# Patient Record
Sex: Male | Born: 1935 | Race: White | Hispanic: No | Marital: Married | Smoking: Former smoker
Health system: Southern US, Community
[De-identification: ages and names within clinical notes are randomized; demographics above are authoritative.]

## PROBLEM LIST (undated history)

## (undated) DIAGNOSIS — C801 Malignant (primary) neoplasm, unspecified: Secondary | ICD-10-CM

## (undated) DIAGNOSIS — M1990 Unspecified osteoarthritis, unspecified site: Secondary | ICD-10-CM

## (undated) DIAGNOSIS — I10 Essential (primary) hypertension: Secondary | ICD-10-CM

## (undated) DIAGNOSIS — R531 Weakness: Secondary | ICD-10-CM

## (undated) DIAGNOSIS — N179 Acute kidney failure, unspecified: Secondary | ICD-10-CM

## (undated) DIAGNOSIS — J40 Bronchitis, not specified as acute or chronic: Secondary | ICD-10-CM

## (undated) DIAGNOSIS — E785 Hyperlipidemia, unspecified: Secondary | ICD-10-CM

## (undated) DIAGNOSIS — G2581 Restless legs syndrome: Secondary | ICD-10-CM

## (undated) DIAGNOSIS — R55 Syncope and collapse: Secondary | ICD-10-CM

## (undated) DIAGNOSIS — IMO0001 Reserved for inherently not codable concepts without codable children: Secondary | ICD-10-CM

## (undated) DIAGNOSIS — F0390 Unspecified dementia without behavioral disturbance: Secondary | ICD-10-CM

## (undated) DIAGNOSIS — I639 Cerebral infarction, unspecified: Secondary | ICD-10-CM

## (undated) DIAGNOSIS — M109 Gout, unspecified: Secondary | ICD-10-CM

## (undated) DIAGNOSIS — K219 Gastro-esophageal reflux disease without esophagitis: Secondary | ICD-10-CM

## (undated) DIAGNOSIS — I219 Acute myocardial infarction, unspecified: Secondary | ICD-10-CM

## (undated) DIAGNOSIS — I6529 Occlusion and stenosis of unspecified carotid artery: Principal | ICD-10-CM

## (undated) DIAGNOSIS — E039 Hypothyroidism, unspecified: Secondary | ICD-10-CM

## (undated) HISTORY — DX: Essential (primary) hypertension: I10

## (undated) HISTORY — PX: COLONOSCOPY W/ POLYPECTOMY: SHX1380

## (undated) HISTORY — DX: Occlusion and stenosis of unspecified carotid artery: I65.29

## (undated) HISTORY — PX: CORONARY ANGIOPLASTY: SHX604

## (undated) HISTORY — DX: Acute myocardial infarction, unspecified: I21.9

## (undated) HISTORY — PX: OTHER SURGICAL HISTORY: SHX169

## (undated) HISTORY — DX: Unspecified osteoarthritis, unspecified site: M19.90

## (undated) HISTORY — PX: CORONARY ARTERY BYPASS GRAFT: SHX141

## (undated) HISTORY — PX: KNEE ARTHROSCOPY: SUR90

---

## 2002-04-30 ENCOUNTER — Encounter: Payer: Self-pay | Admitting: Vascular Surgery

## 2002-05-01 ENCOUNTER — Ambulatory Visit (HOSPITAL_COMMUNITY): Admission: RE | Admit: 2002-05-01 | Discharge: 2002-05-01 | Payer: Self-pay | Admitting: Vascular Surgery

## 2002-05-01 HISTORY — PX: OTHER SURGICAL HISTORY: SHX169

## 2009-07-15 ENCOUNTER — Ambulatory Visit: Payer: Self-pay | Admitting: Vascular Surgery

## 2010-04-06 ENCOUNTER — Ambulatory Visit: Payer: Self-pay | Admitting: Vascular Surgery

## 2010-10-04 NOTE — Procedures (Signed)
CAROTID DUPLEX EXAM   INDICATION:  Carotid stenosis.   HISTORY:  Diabetes:  Yes.  Cardiac:  MI.  Hypertension:  Yes.  Smoking:  Previous.  Previous Surgery:  No.  CV History:  Asymptomatic.  Amaurosis Fugax No, Paresthesias No, Hemiparesis No.                                       RIGHT             LEFT  Brachial systolic pressure:         147               155  Brachial Doppler waveforms:         Normal            Normal  Vertebral direction of flow:        Antegrade         Antegrade  DUPLEX VELOCITIES (cm/sec)  CCA peak systolic                   73                89  ECA peak systolic                   93                424  ICA peak systolic                   327               150  ICA end diastolic                   80                32  PLAQUE MORPHOLOGY:                  Heterogenous      Heterogenous  PLAQUE AMOUNT:                      Moderate          Mild/moderate  PLAQUE LOCATION:                    ICA, CCA, ECA     ICA, ECA, CCA   IMPRESSION:  1. Right internal carotid artery velocities suggest a 60% to 79%      stenosis.  2. Left internal carotid artery velocities suggest a 40% to 59%      stenosis.  3. Left external carotid artery stenosis.   ___________________________________________  Di Kindle. Edilia Bo, M.D.   EM/MEDQ  D:  04/06/2010  T:  04/06/2010  Job:  [id number]

## 2010-10-04 NOTE — Procedures (Signed)
CAROTID DUPLEX EXAM   INDICATION:  Carotid bruit.   HISTORY:  Diabetes:  Yes.  Cardiac:  MI.  Hypertension:  Yes.  Smoking:  Previous.  Previous Surgery:  No carotid surgery.  CV History:  Asymptomatic.  Amaurosis Fugax No, Paresthesias No, Hemiparesis No                                       RIGHT             LEFT  Brachial systolic pressure:         158               150  Brachial Doppler waveforms:         WNL               WNL  Vertebral direction of flow:        Antegrade         Antegrade  DUPLEX VELOCITIES (cm/sec)  CCA peak systolic                   101               108  ECA peak systolic                   97                303  ICA peak systolic                   240               149  ICA end diastolic                   65                34  PLAQUE MORPHOLOGY:                  Heterogeneous     Heterogeneous  PLAQUE AMOUNT:                      Moderate          Mild / moderate  PLAQUE LOCATION:                    ICA / ECA / CCA   ICA / ECA / CCA   IMPRESSION:  1. Right internal carotid artery shows evidence of 60%-79% stenosis.  2. Left internal carotid artery shows evidence of 40%-59% stenosis.  3. Left external carotid artery stenosis.   ___________________________________________  Di Kindle. Edilia Bo, M.D.   AS/MEDQ  D:  07/15/2009  T:  07/15/2009  Job:  [id number]

## 2010-10-04 NOTE — Consult Note (Signed)
NEW PATIENT CONSULTATION   CHIPPER, KOUDELKA  DOB:  May 31, 1932                                       07/15/2009  ZOXWR#:[id number]   I saw the patient in the office today in consultation concerning his  peripheral vascular disease.  This is a pleasant 77 year old gentleman  who states that he has had years of mild claudication symptoms in his  right calf.  His symptoms are brought on by ambulation and relieved with  rest.  Symptoms were barely noticeable for years.  However,  approximately 3-4 months ago he began noticing some progression of  symptoms in his right calf.  He would experience pain in the calf  associated with ambulation and relieved with rest.  He had no  significant thigh or hip claudication and no symptoms of the left leg.  There were no other aggravating or alleviating factors.  He has no  significant pain at rest.  There are no associated symptoms.  Dr. Shary Decamp  arranged a Doppler study which showed evidence of moderate arterial  insufficiency on the right and he was sent for vascular consultation.   PAST MEDICAL HISTORY:  Significant for type 2 diabetes.  He does not  require insulin.  In addition, he has hypertension, hypercholesterolemia  and coronary artery disease.  He had a myocardial infarction in 2008.  He denies any history of congestive heart failure and has had no history  of COPD.  He has had a history of a DVT in the left leg approximately 6  years ago and he also had an IVC filter placed in the past.  He  underwent coronary revascularization approximately 13-14 years ago.  He  has also had knee surgery bilaterally.   FAMILY HISTORY:  His father died from a myocardial infarction at age 36.  He is unaware of any other history of premature cardiovascular disease.   SOCIAL HISTORY:  He is married.  He does not have any biologic children.  He quit tobacco in 1979.   MEDICATIONS:  His medications are documented on the medical history  form  in his chart.  He is on aspirin.   REVIEW OF SYSTEMS:  GENERAL:  He has had no recent weight loss, weight  gain or problems with his appetite.  CARDIOVASCULAR:  He has had no chest pain, chest pressure, palpitations  or arrhythmias.  He has had no significant orthopnea or dyspnea on  exertion.  He has had no history of stroke, TIAs, expressive or  receptive aphasia or amaurosis fugax.  MUSCULOSKELETAL:  He does have pain in both knees, especially the right  knee.  HEMATOLOGIC:  He has had a previous DVT as described above.  He has had  no bleeding problems that he is aware of.  PULMONARY:  He has had occasional bronchitis.  GI, GU, neurologic, psychiatric, ENT and integumentary review of systems  is unremarkable and is documented on the medical history form in his  chart.   PHYSICAL EXAMINATION:  General:  This is a pleasant 77 year old  gentleman who appears his stated age.  Vital signs:  Blood pressure is  160/74, heart rate is 70, temperature is 98.  HEENT:  Unremarkable.  Lungs:  Are clear bilaterally to auscultation without rales, rhonchi or  wheezing.  Cardiovascular:  He has a right carotid bruit.  He has a  regular rate and rhythm without murmur appreciated.  He has no  significant peripheral edema.  He has palpable femoral pulses.  On the  left side he has a palpable popliteal and dorsalis pedis pulse.  I  cannot palpate a posterior tibial pulse.  On the right side he has a  normal femoral pulse but I am unable to palpate popliteal or pedal  pulses on the right.  Abdomen:  His abdomen is soft and nontender.  No  masses are appreciated.  He has normal pitched bowel sounds.  I cannot  appreciate an aneurysm.  Musculoskeletal:  There are no major  deformities or cyanosis.  Neurologic:  He has no focal weakness or  paresthesias.  Skin:  There are no ulcers or rashes.  Lymphatic:  He has  no significant cervical, axillary or inguinal lymphadenopathy.  He does  have mild  swelling more significant swelling in the left leg than the  right leg.  He had vein previously taken from the left leg for his CABG  and also has had a DVT in the left leg before.   I did review his arterial Doppler study which was done on 06/29/2009 at  [facility] in [place] and this showed that he had monophasic  signals in the right leg below the inguinal level although his vessels  were not compressible so ABI could not be obtained on the right.  He was  felt to most likely have a short segment occlusion of the distal right  superficial femoral artery.  On the left side he had an ABI of 100%  although this too may be falsely elevated because of calcific disease.  Toe pressure on the right was 27 mmHg and on the left 13 mmHg consistent  with significant infrainguinal arterial occlusive disease.   Given the carotid bruit on the right we did obtain a carotid duplex scan  which shows a 60%-79% right carotid stenosis.  He has no significant  stenosis on the left although he has an external carotid artery stenosis  on the left.  Left internal carotid artery only has a 40%-59% stenosis.   With respect to his peripheral vascular disease I think his symptoms are  quite stable and I would not recommend any further workup unless he  developed disabling claudication, rest pain or nonhealing ulcers.  We  have discussed the importance of a structured walking program.   With respect to his carotid stenosis I have recommended a followup  carotid duplex scan in 6 months and I will see him back at that time.  He knows to continue taking his aspirin.  We have also reviewed the  potential symptoms of cerebrovascular disease and he knows to call if he  develops any new symptoms.  We will see him back in 6 months.     Di Kindle. Edilia Bo, M.D.  Electronically Signed   CSD/MEDQ  D:  07/15/2009  T:  07/16/2009  Job:  [id number]   cc:   Feliciana Rossetti, MD

## 2010-10-07 NOTE — Op Note (Signed)
   NAME:  Johnny Murphy, Johnny Murphy                          ACCOUNT NO.:  [id number]   MEDICAL RECORD NO.:  [id number]                   PATIENT TYPE:  OIB   LOCATION:  NA                                   FACILITY:  [facility]   PHYSICIAN:  Larina Earthly, M.D.                 DATE OF BIRTH:  10-Dec-1932   DATE OF PROCEDURE:  05/01/2002  DATE OF DISCHARGE:                                 OPERATIVE REPORT   PREOPERATIVE DIAGNOSES:  Extension of left leg deep venous thrombosis on  Coumadin therapy.   POSTOPERATIVE DIAGNOSES:  Extension of left leg deep venous thrombosis on  Coumadin therapy.   PROCEDURE:  1. Inferior venacavagram.  2. Placement of TrapEase vena caval filter.   SURGEON:  Larina Earthly, M.D.   ANESTHESIA:  1% lidocaine local.   COMPLICATIONS:  None.   DISPOSITION:  To holding area stable.   DESCRIPTION OF PROCEDURE:  The patient was taken to the peripheral vascular  cath lab, placed in supine position where the area of the right groin was  prepped and draped in the usual sterile fashion. Using local anesthesia and  a single wall puncture, the right common femoral vein was entered and a  guidewire was passed up to the level of the renal veins. The TrapEase sheath  was positioned at the level of L2 and a flush venacavagram was undertaken.  This revealed the takeoff of the right and left renal veins. The right renal  vein was the lowest renal vein. The flush catheter was then removed and the  TrapEase vena caval filter was positioned in the sheath and this was  deployed at approximately L2 just below the takeoff of the right renal vein.  There was good positioning of the filter. The sheath was removed and  pressure was held. The patient had no immediate complications and was  transferred to the holding area in stable condition.                                               Larina Earthly, M.D.    TFE/MEDQ  D:  05/01/2002  T:  05/01/2002  Job:  [id number]   cc:   Altamese Cornish,  M.D.  [place], [place]

## 2010-11-18 ENCOUNTER — Ambulatory Visit: Payer: Self-pay

## 2010-11-18 ENCOUNTER — Other Ambulatory Visit: Payer: Self-pay

## 2010-12-02 ENCOUNTER — Other Ambulatory Visit (INDEPENDENT_AMBULATORY_CARE_PROVIDER_SITE_OTHER): Payer: Medicare Other

## 2010-12-02 ENCOUNTER — Ambulatory Visit (INDEPENDENT_AMBULATORY_CARE_PROVIDER_SITE_OTHER): Payer: Medicare Other | Admitting: Thoracic Diseases

## 2010-12-02 DIAGNOSIS — I10 Essential (primary) hypertension: Secondary | ICD-10-CM

## 2010-12-02 DIAGNOSIS — I6529 Occlusion and stenosis of unspecified carotid artery: Secondary | ICD-10-CM

## 2010-12-02 DIAGNOSIS — M1711 Unilateral primary osteoarthritis, right knee: Secondary | ICD-10-CM | POA: Insufficient documentation

## 2010-12-02 DIAGNOSIS — E1151 Type 2 diabetes mellitus with diabetic peripheral angiopathy without gangrene: Secondary | ICD-10-CM

## 2010-12-02 HISTORY — DX: Occlusion and stenosis of unspecified carotid artery: I65.29

## 2010-12-02 NOTE — Patient Instructions (Signed)
Pt given information on stroke symptomotology and carotid disease.

## 2010-12-02 NOTE — Progress Notes (Signed)
VASCULAR AND VEIN SURGERY CAROTID FOLLOW-UP  HPI: Johnny Murphy is a 77 y.o. male right handed who has known carotid disease. Pt. has not had surgical intervention. Patient is doing well. Pt. denies symptoms of TIA or Stroke. Pt. denies weakness;pt denies symptoms of amaurosis fugax; denies monocular blindness. Pt. Has normal speech denies aphasic symptoms   Non-Invasive Vascular Imaging CAROTID DUPLEX (Date: 12/02/2010):  Right ICA 60 - 79 % stenosis Left ICA 20 - 39 % stenosis  These findings are Unchanged from previous exam  Medications reviewed and unchanged. Pt is on Betablocker and ASA 81mg  BID  Past Medical History  Diagnosis Date  . Diabetes mellitus   . Hypertension   . Myocardial infarction   . Arthritis     ROS: 12 point ROS negative except for as noted in PMHx and hx fractured right foot not requiring surgery  Physical Exam: Pt is A&O x 3 Gait is normal Negative Bilateral carotid bruit/s Speech is normal 5/5 strength BUE/BLE  Negative tongue deviation Negative facial droop  Plan: Follow-up in 6 months with Carotid Duplex scan and letter  Pt was given information regarding stroke symptoms and prevention

## 2010-12-14 NOTE — Procedures (Unsigned)
CAROTID DUPLEX EXAM  INDICATION:  Follow up known carotid disease.  HISTORY: Diabetes:  Yes. Cardiac:  Yes. Hypertension:  Yes. Smoking:  Previous. Previous Surgery: CV History: Amaurosis Fugax No, Paresthesias No, Hemiparesis No.                                      RIGHT               LEFT Brachial systolic pressure:         130                 110 Brachial Doppler waveforms: Vertebral direction of flow:        Abnormal antegrade  Antegrade DUPLEX VELOCITIES (cm/sec) CCA peak systolic                   97                  78 ECA peak systolic                   84                  274 ICA peak systolic                   272                 140 ICA end diastolic                   84                  30 PLAQUE MORPHOLOGY:                  Heterogenous        Heterogenous PLAQUE AMOUNT:                      Moderate            Mild PLAQUE LOCATION:                    CCA/ICA/subclavian  CCA/ECA/ICA  IMPRESSION: 1. 60% to 79% right internal carotid artery stenosis. 2. 1% to 39% left internal carotid artery stenosis with distal     arterial tortuosity. 3. Significant stenosis of right subclavian artery with velocities at     346 cm/s.  The right vertebral artery is abnormal. 4. Left subclavian and vertebral artery are within normal limits.  ___________________________________________ Di Kindle. Edilia Bo, M.D.  LT/MEDQ  D:  12/02/2010  T:  12/02/2010  Job:  [id number]

## 2011-06-09 ENCOUNTER — Other Ambulatory Visit: Payer: Medicare Other

## 2012-03-29 ENCOUNTER — Encounter: Payer: Self-pay | Admitting: Vascular Surgery

## 2015-08-28 ENCOUNTER — Encounter (HOSPITAL_COMMUNITY): Payer: Self-pay | Admitting: *Deleted

## 2015-08-28 ENCOUNTER — Observation Stay (HOSPITAL_COMMUNITY)
Admission: AD | Admit: 2015-08-28 | Discharge: 2015-09-02 | Disposition: A | Discharge status: Home / Self Care | Payer: Medicare Other | Source: Other Acute Inpatient Hospital | Attending: Internal Medicine | Admitting: Internal Medicine

## 2015-08-28 DIAGNOSIS — E1151 Type 2 diabetes mellitus with diabetic peripheral angiopathy without gangrene: Secondary | ICD-10-CM | POA: Insufficient documentation

## 2015-08-28 DIAGNOSIS — I6503 Occlusion and stenosis of bilateral vertebral arteries: Secondary | ICD-10-CM | POA: Insufficient documentation

## 2015-08-28 DIAGNOSIS — Z951 Presence of aortocoronary bypass graft: Secondary | ICD-10-CM | POA: Diagnosis not present

## 2015-08-28 DIAGNOSIS — G459 Transient cerebral ischemic attack, unspecified: Secondary | ICD-10-CM | POA: Diagnosis present

## 2015-08-28 DIAGNOSIS — E039 Hypothyroidism, unspecified: Secondary | ICD-10-CM | POA: Diagnosis present

## 2015-08-28 DIAGNOSIS — E118 Type 2 diabetes mellitus with unspecified complications: Secondary | ICD-10-CM | POA: Diagnosis not present

## 2015-08-28 DIAGNOSIS — I5032 Chronic diastolic (congestive) heart failure: Secondary | ICD-10-CM | POA: Diagnosis not present

## 2015-08-28 DIAGNOSIS — I2510 Atherosclerotic heart disease of native coronary artery without angina pectoris: Secondary | ICD-10-CM | POA: Insufficient documentation

## 2015-08-28 DIAGNOSIS — I252 Old myocardial infarction: Secondary | ICD-10-CM | POA: Insufficient documentation

## 2015-08-28 DIAGNOSIS — E1122 Type 2 diabetes mellitus with diabetic chronic kidney disease: Secondary | ICD-10-CM | POA: Diagnosis not present

## 2015-08-28 DIAGNOSIS — R531 Weakness: Secondary | ICD-10-CM | POA: Diagnosis not present

## 2015-08-28 DIAGNOSIS — Z9861 Coronary angioplasty status: Secondary | ICD-10-CM | POA: Diagnosis not present

## 2015-08-28 DIAGNOSIS — Z8673 Personal history of transient ischemic attack (TIA), and cerebral infarction without residual deficits: Secondary | ICD-10-CM | POA: Diagnosis not present

## 2015-08-28 DIAGNOSIS — Z794 Long term (current) use of insulin: Secondary | ICD-10-CM | POA: Diagnosis not present

## 2015-08-28 DIAGNOSIS — I10 Essential (primary) hypertension: Secondary | ICD-10-CM | POA: Diagnosis not present

## 2015-08-28 DIAGNOSIS — E785 Hyperlipidemia, unspecified: Secondary | ICD-10-CM | POA: Diagnosis not present

## 2015-08-28 DIAGNOSIS — I429 Cardiomyopathy, unspecified: Secondary | ICD-10-CM | POA: Insufficient documentation

## 2015-08-28 DIAGNOSIS — N179 Acute kidney failure, unspecified: Secondary | ICD-10-CM | POA: Diagnosis not present

## 2015-08-28 DIAGNOSIS — I130 Hypertensive heart and chronic kidney disease with heart failure and stage 1 through stage 4 chronic kidney disease, or unspecified chronic kidney disease: Secondary | ICD-10-CM | POA: Insufficient documentation

## 2015-08-28 DIAGNOSIS — F0390 Unspecified dementia without behavioral disturbance: Secondary | ICD-10-CM | POA: Insufficient documentation

## 2015-08-28 DIAGNOSIS — I441 Atrioventricular block, second degree: Secondary | ICD-10-CM | POA: Insufficient documentation

## 2015-08-28 DIAGNOSIS — Z87891 Personal history of nicotine dependence: Secondary | ICD-10-CM | POA: Diagnosis not present

## 2015-08-28 DIAGNOSIS — I6521 Occlusion and stenosis of right carotid artery: Secondary | ICD-10-CM | POA: Diagnosis present

## 2015-08-28 DIAGNOSIS — Z7982 Long term (current) use of aspirin: Secondary | ICD-10-CM | POA: Insufficient documentation

## 2015-08-28 DIAGNOSIS — N183 Chronic kidney disease, stage 3 (moderate): Secondary | ICD-10-CM | POA: Diagnosis not present

## 2015-08-28 DIAGNOSIS — E1165 Type 2 diabetes mellitus with hyperglycemia: Secondary | ICD-10-CM | POA: Diagnosis not present

## 2015-08-28 DIAGNOSIS — I6529 Occlusion and stenosis of unspecified carotid artery: Secondary | ICD-10-CM | POA: Diagnosis present

## 2015-08-28 DIAGNOSIS — R55 Syncope and collapse: Secondary | ICD-10-CM | POA: Diagnosis present

## 2015-08-28 DIAGNOSIS — G450 Vertebro-basilar artery syndrome: Secondary | ICD-10-CM

## 2015-08-28 DIAGNOSIS — E119 Type 2 diabetes mellitus without complications: Secondary | ICD-10-CM

## 2015-08-28 DIAGNOSIS — I6523 Occlusion and stenosis of bilateral carotid arteries: Secondary | ICD-10-CM | POA: Insufficient documentation

## 2015-08-28 HISTORY — DX: Acute kidney failure, unspecified: N17.9

## 2015-08-28 LAB — GLUCOSE, CAPILLARY
Glucose-Capillary: 272 mg/dL — ABNORMAL HIGH (ref 65–99)
Glucose-Capillary: 306 mg/dL — ABNORMAL HIGH (ref 65–99)

## 2015-08-28 LAB — PHOSPHORUS: Phosphorus: 3.6 mg/dL (ref 2.5–4.6)

## 2015-08-28 LAB — MAGNESIUM: Magnesium: 1.5 mg/dL — ABNORMAL LOW (ref 1.7–2.4)

## 2015-08-28 LAB — TSH: TSH: 2.51 u[IU]/mL (ref 0.350–4.500)

## 2015-08-28 LAB — TROPONIN I: TROPONIN I: 0.03 ng/mL (ref <0.031)

## 2015-08-28 MED ORDER — PRAVASTATIN SODIUM 20 MG PO TABS
20.0000 mg | ORAL_TABLET | Freq: Every day | ORAL | Status: DC
  Administered 2015-08-28 – 2015-09-01 (×5): 20 mg via ORAL
  Filled 2015-08-28 (×5): qty 1

## 2015-08-28 MED ORDER — STROKE: EARLY STAGES OF RECOVERY BOOK
Freq: Once | Status: AC
  Administered 2015-08-28: 23:00:00

## 2015-08-28 MED ORDER — ONDANSETRON HCL 4 MG PO TABS: 4.0000 mg | ORAL_TABLET | Freq: Four times a day (QID) | ORAL | Status: DC | PRN

## 2015-08-28 MED ORDER — ASPIRIN 81 MG PO CHEW
162.0000 mg | CHEWABLE_TABLET | Freq: Every day | ORAL | Status: DC
  Administered 2015-08-29: 162 mg via ORAL
  Filled 2015-08-28: qty 2

## 2015-08-28 MED ORDER — LEVOTHYROXINE SODIUM 25 MCG PO TABS
25.0000 ug | ORAL_TABLET | Freq: Every day | ORAL | Status: DC
  Administered 2015-08-29 – 2015-09-02 (×5): 25 ug via ORAL
  Filled 2015-08-28 (×5): qty 1

## 2015-08-28 MED ORDER — INSULIN GLARGINE 100 UNIT/ML ~~LOC~~ SOLN
10.0000 [IU] | Freq: Every day | SUBCUTANEOUS | Status: DC
  Administered 2015-08-28: 10 [IU] via SUBCUTANEOUS
  Filled 2015-08-28 (×2): qty 0.1

## 2015-08-28 MED ORDER — SENNOSIDES-DOCUSATE SODIUM 8.6-50 MG PO TABS: 1.0000 | ORAL_TABLET | Freq: Every evening | ORAL | Status: DC | PRN

## 2015-08-28 MED ORDER — LINAGLIPTIN 5 MG PO TABS
5.0000 mg | ORAL_TABLET | Freq: Every day | ORAL | Status: DC
  Administered 2015-08-29: 5 mg via ORAL
  Filled 2015-08-28: qty 1

## 2015-08-28 MED ORDER — SODIUM CHLORIDE 0.9% FLUSH
3.0000 mL | Freq: Two times a day (BID) | INTRAVENOUS | Status: DC
  Administered 2015-08-28 – 2015-09-01 (×5): 3 mL via INTRAVENOUS

## 2015-08-28 MED ORDER — ENOXAPARIN SODIUM 40 MG/0.4ML ~~LOC~~ SOLN
40.0000 mg | SUBCUTANEOUS | Status: DC
  Administered 2015-08-28: 40 mg via SUBCUTANEOUS
  Filled 2015-08-28: qty 0.4

## 2015-08-28 MED ORDER — ONDANSETRON HCL 4 MG/2ML IJ SOLN: 4.0000 mg | Freq: Four times a day (QID) | INTRAMUSCULAR | Status: DC | PRN

## 2015-08-28 MED ORDER — GLIPIZIDE ER 10 MG PO TB24
10.0000 mg | ORAL_TABLET | Freq: Two times a day (BID) | ORAL | Status: DC
  Filled 2015-08-28: qty 1

## 2015-08-28 MED ORDER — PANTOPRAZOLE SODIUM 40 MG PO TBEC
40.0000 mg | DELAYED_RELEASE_TABLET | Freq: Every day | ORAL | Status: DC
  Administered 2015-08-29 – 2015-09-02 (×5): 40 mg via ORAL
  Filled 2015-08-28 (×5): qty 1

## 2015-08-28 NOTE — Progress Notes (Signed)
Transfer from [name] is a 82 year old male with past medical history significant for HTN, DM type 2, CAD, MI; who presented with 15 minutes of confusion and left-sided weakness. Once EMS arrived patient was noted to have unequal pupils, but back to baseline. Upon arrival to the emergency department patient was noted to have equal pupils, alert and oriented 3, and with without any focal deficits. CT of the brain was seen to have significant signs of atherosclerotic disease. Neurology was called and recommended angiogram and to admit to the hospitalist for observation. Vital signs otherwise noted to be stable. Initial lab work showed a elevated blood glucose 435.

## 2015-08-28 NOTE — Consult Note (Signed)
Reason for consult: syncope  HPI:  82 yo WM with history of HTN, DM, and high lipids presents with a short duration syncope of seconds while eating at a restaurant.  I do not have any detailed eyewitness accounts.  He denies any prodromal dizziness, lightheadedness, chest pain, shortness of breath, or palpitations.  There is some verbal information related to pupillary abnormalities but I do not detect anything.  His glucose was as high as 435.  He had missed that morning's meds. No clear indication of stiffness or abnormal movements.  No incontinence or tongue bite.    CT brain was negative per outside hospital.  Records show he has a history of right ICA stenosis about 3 years ago but without any TIA or strokes in the past.    Meds: 1. ASA 162 mg qd 2. Lovenox 40 mg SQ qd 3. Glipizide XL 20 mg qd 4. Lantus 10 Units qd 5. Synthroid 25 mcg qd 6. Linagliptin 5 mg qd 7. Pravachol 20 mg qd  179/70 P 63 T98 Neuro exam normal.   A/P: Very short duration syncope of unclear etiology.  ?hyperglycemia related.  The prior carotid stenosis may be a factor in reducing cerebral blood flow.  An MRI/MRA is pending.  If ICA stenosis >=70%, I would recommend CEA even if he has not had any TIA or stroke before since syncope may be related and his DM /BP are not well controlled and he would be at high risk of eventual stroke.  He is very high functioning for his age.    The ASA can be reduced to 81 mg qd since higher doses not beneficial.  Since this was not TIA or stroke, no change in antiplatelet therapy is warranted.

## 2015-08-28 NOTE — H&P (Signed)
[facility] History and Physical  Johnny Murphy [id number] DOB: 08-Sep-1932 DOA: 08/28/2015  Referring physician: [facility] ED. PCP: Gilford Rile, MD   Chief Complaint: Syncope.  HPI: Johnny Murphy is a 82 y.o. male with a past medical history of hypertension, carotid artery stenosis, diabetes, hyperlipidemia, hypothyroidism, who is being admitted to the neurology floor after presenting to [facility] via EMS due to syncope.  Per patient, he was eating breakfast with a friend at [employer], when he suddenly passed out without describing any prodromal symptoms. Apparently, when EMS arrived to the restroom, the patient had pinpointed pupils and was unable to move his left-sided body. He subsequently woke up and then was totally asymptomatic.He denies chest pain, Orthopnea, PND, edema of lower extremities, anasarca, dizziness, palpitations. He denies weakness, numbness, headache or vision changes.  Patient is currently a symptomatic and in no acute distress.CT scan done [facility] was negative for acute events. Labs show hyperglycemia and mild anemia.   Review of Systems:  Constitutional:  No weight loss, night sweats, Fevers, chills, fatigue.  HEENT:  No headaches, Difficulty swallowing,Tooth/dental problems,Sore throat,  No sneezing, itching, ear ache, nasal congestion, post nasal drip,  Cardio-vascular:  As above mentioned. GI:  No heartburn, indigestion, abdominal pain, nausea, vomiting, diarrhea, change in bowel habits, loss of appetite  Resp:  No shortness of breath with exertion or at rest. No excess mucus, no productive cough, No non-productive cough, No coughing up of blood.No change in color of mucus.No wheezing.No chest wall deformity  Skin:  no rash or lesions.  GU:  no dysuria, change in color of urine, no urgency or frequency. No flank pain.  Musculoskeletal:  No joint pain or swelling. No decreased range of motion. No back pain.  Psych:    No change in mood or affect. No depression or anxiety. No memory loss.  Neuro: As above mentioned. Past Medical History  Diagnosis Date  . Diabetes mellitus   . Hypertension   . Myocardial infarction ([place])   . Arthritis   . Carotid stenosis, asymptomatic 12/02/2010   History reviewed. No pertinent past surgical history. Social History:  reports that he has quit smoking. His smoking use included Cigarettes. He quit smokeless tobacco use about 37 years ago. His alcohol and drug histories are not on file.  No Known Allergies  History reviewed. No pertinent family history.   Prior to Admission medications   Medication Sig Start Date End Date Taking? Authorizing Provider  aspirin 81 MG tablet Take 81 mg by mouth daily.   Yes Historical Provider, MD  insulin glargine (LANTUS) 100 UNIT/ML injection Inject 10 Units into the skin at bedtime.   Yes Historical Provider, MD  levothyroxine (SYNTHROID, LEVOTHROID) 25 MCG tablet Take 25 mcg by mouth daily before breakfast.   Yes Historical Provider, MD  saxagliptin HCl (ONGLYZA) 5 MG TABS tablet Take 5 mg by mouth daily.   Yes Historical Provider, MD  amLODipine (NORVASC) 5 MG tablet  09/19/10   Historical Provider, MD  carvedilol (COREG) 25 MG tablet  11/04/10   Historical Provider, MD  GLIPIZIDE XL 10 MG 24 hr tablet  09/08/10   Historical Provider, MD  lisinopril-hydrochlorothiazide (PRINZIDE,ZESTORETIC) 20-25 MG per tablet  09/08/10   Historical Provider, MD  metFORMIN (GLUCOPHAGE-XR) 500 MG 24 hr tablet  09/08/10   Historical Provider, MD  omeprazole (PRILOSEC) 20 MG capsule  09/08/10   Historical Provider, MD  pravastatin (PRAVACHOL) 20 MG tablet  11/04/10   Historical Provider, MD  Physical Exam: Filed Vitals:   08/28/15 1817 08/28/15 2000 08/28/15 2200  BP: 174/81 179/70 163/66  Pulse: 65 63   Temp: 98.1 F (36.7 C) 98 F (36.7 C) 98 F (36.7 C)  TempSrc: Oral Oral Oral  Resp: 16 16 16   Height: 5\' 8"  (1.727 m)    Weight: 78.926 kg (174  lb)    SpO2: 100% 99% 98%    Wt Readings from Last 3 Encounters:  08/28/15 78.926 kg (174 lb)    General:  Appears calm and comfortable Eyes: PERRL, normal lids, irises & conjunctiva ENT: grossly normal hearing, lips & tongue Neck: no LAD, masses or thyromegaly Cardiovascular: RRR, no m/r/g. No LE edema. Telemetry: SR, no arrhythmias  Respiratory: CTA bilaterally, no w/r/r. Normal respiratory effort. Abdomen: soft, ntnd Skin: no rash or induration seen on limited exam Musculoskeletal: grossly normal tone BUE/BLE Psychiatric: grossly normal mood and affect, speech fluent and appropriate Neurologic: Awake, alert, oriented 4, grossly non-focal.          Labs on Admission:  Labs from [facility] were reviewed.  Basic Metabolic Panel:  Recent Labs Lab 08/28/15 2037  MG 1.5*  PHOS 3.6   Cardiac Enzymes:  Recent Labs Lab 08/28/15 2037  TROPONINI 0.03    CBG:  Recent Labs Lab 08/28/15 1812 08/28/15 2228  GLUCAP 272* 306*     EKG: Independently reviewed. PR interval  226 ms QRS duration 120 ms QT/QTc 454/465 ms PE-R-T axes 116  -38  194 Sinus rhythm with her this degree AV block at 63 bpm Left axis deviation Pulmonary disease pattern Nonspecific intraventricular conduction delay ST and T wave abnormality, consider inferolateral ischemia  Assessment/Plan Principal Problem:   TIA (transient ischemic attack) Admit to neuro telemetry floor/observation. Frequent neuro checks. Continue aspirin. Check carotid Dopplers. Check MRI/MRA to the brain Check echocardiogram. Check fasting lipids and hemoglobin A1c. Neurology consult.  Active Problems:   HTN (hypertension) Hold antihypertensives for now and monitor blood pressure    Carotid stenosis Continue aspirin. Check MRI/MRA of the brain. Check carotid Dopplers.    DM type 2 (diabetes mellitus, type 2) (HCC) Continue Lantus. CBG monitoring with regular insulin sliding scale. Check hemoglobin  A1c.    Hyperlipidemia Check fasting lipids in the morning. Continue Pravachol 20 mg by mouth daily. Monitor LFTs periodically.    Hypothyroidism Continue Synthroid. Monitor TSH periodically.    Code Status: Full code. DVT Prophylaxis: Lovenox. Family Communication:  Disposition Plan: Admit for further evaluation and treatment.  Time spent: Over 70 minutes were used during the process of this admission.  Reubin Milan, M.D. [facility] Pager [phone].

## 2015-08-29 ENCOUNTER — Observation Stay (HOSPITAL_COMMUNITY): Payer: Medicare Other

## 2015-08-29 ENCOUNTER — Encounter (HOSPITAL_COMMUNITY): Payer: Self-pay | Admitting: Radiology

## 2015-08-29 DIAGNOSIS — I6521 Occlusion and stenosis of right carotid artery: Secondary | ICD-10-CM | POA: Diagnosis not present

## 2015-08-29 DIAGNOSIS — Z794 Long term (current) use of insulin: Secondary | ICD-10-CM

## 2015-08-29 DIAGNOSIS — E118 Type 2 diabetes mellitus with unspecified complications: Secondary | ICD-10-CM | POA: Diagnosis not present

## 2015-08-29 DIAGNOSIS — E039 Hypothyroidism, unspecified: Secondary | ICD-10-CM

## 2015-08-29 DIAGNOSIS — I6503 Occlusion and stenosis of bilateral vertebral arteries: Secondary | ICD-10-CM

## 2015-08-29 DIAGNOSIS — R55 Syncope and collapse: Secondary | ICD-10-CM | POA: Diagnosis not present

## 2015-08-29 DIAGNOSIS — I10 Essential (primary) hypertension: Secondary | ICD-10-CM | POA: Diagnosis not present

## 2015-08-29 DIAGNOSIS — G459 Transient cerebral ischemic attack, unspecified: Secondary | ICD-10-CM | POA: Diagnosis not present

## 2015-08-29 LAB — COMPREHENSIVE METABOLIC PANEL
ALK PHOS: 77 U/L (ref 38–126)
ALT: 26 U/L (ref 17–63)
AST: 31 U/L (ref 15–41)
Albumin: 3 g/dL — ABNORMAL LOW (ref 3.5–5.0)
Anion gap: 10 (ref 5–15)
BUN: 25 mg/dL — AB (ref 6–20)
CALCIUM: 8.9 mg/dL (ref 8.9–10.3)
CO2: 23 mmol/L (ref 22–32)
CREATININE: 1.49 mg/dL — AB (ref 0.61–1.24)
Chloride: 99 mmol/L — ABNORMAL LOW (ref 101–111)
GFR calc non Af Amer: 43 mL/min — ABNORMAL LOW (ref >60)
GFR, EST AFRICAN AMERICAN: 49 mL/min — AB (ref >60)
GLUCOSE: 347 mg/dL — AB (ref 65–99)
Potassium: 4.2 mmol/L (ref 3.5–5.1)
SODIUM: 132 mmol/L — AB (ref 135–145)
Total Bilirubin: 0.7 mg/dL (ref 0.3–1.2)
Total Protein: 5.5 g/dL — ABNORMAL LOW (ref 6.5–8.1)

## 2015-08-29 LAB — CBC WITH DIFFERENTIAL/PLATELET
BASOS ABS: 0.1 10*3/uL (ref 0.0–0.1)
Basophils Relative: 1 %
EOS ABS: 0.3 10*3/uL (ref 0.0–0.7)
Eosinophils Relative: 4 %
HCT: 35.2 % — ABNORMAL LOW (ref 39.0–52.0)
HEMOGLOBIN: 12 g/dL — AB (ref 13.0–17.0)
LYMPHS ABS: 2.2 10*3/uL (ref 0.7–4.0)
LYMPHS PCT: 30 %
MCH: 31.3 pg (ref 26.0–34.0)
MCHC: 34.1 g/dL (ref 30.0–36.0)
MCV: 91.9 fL (ref 78.0–100.0)
Monocytes Absolute: 0.5 10*3/uL (ref 0.1–1.0)
Monocytes Relative: 6 %
NEUTROS PCT: 59 %
Neutro Abs: 4.4 10*3/uL (ref 1.7–7.7)
Platelets: 173 10*3/uL (ref 150–400)
RBC: 3.83 MIL/uL — AB (ref 4.22–5.81)
RDW: 12.6 % (ref 11.5–15.5)
WBC: 7.4 10*3/uL (ref 4.0–10.5)

## 2015-08-29 LAB — GLUCOSE, CAPILLARY
GLUCOSE-CAPILLARY: 294 mg/dL — AB (ref 65–99)
Glucose-Capillary: 312 mg/dL — ABNORMAL HIGH (ref 65–99)
Glucose-Capillary: 175 mg/dL — ABNORMAL HIGH (ref 65–99)

## 2015-08-29 LAB — LIPID PANEL
CHOL/HDL RATIO: 6 ratio
CHOLESTEROL: 180 mg/dL (ref 0–200)
HDL: 30 mg/dL — ABNORMAL LOW (ref >40)
LDL CALC: 95 mg/dL (ref 0–99)
Triglycerides: 274 mg/dL — ABNORMAL HIGH (ref <150)
VLDL: 55 mg/dL — AB (ref 0–40)

## 2015-08-29 LAB — TROPONIN I: Troponin I: 0.03 ng/mL (ref <0.031)

## 2015-08-29 MED ORDER — INSULIN GLARGINE 100 UNIT/ML ~~LOC~~ SOLN
8.0000 [IU] | Freq: Every day | SUBCUTANEOUS | Status: DC
  Administered 2015-08-29 – 2015-09-02 (×5): 8 [IU] via SUBCUTANEOUS
  Filled 2015-08-29 (×5): qty 0.08

## 2015-08-29 MED ORDER — CLOPIDOGREL BISULFATE 75 MG PO TABS
75.0000 mg | ORAL_TABLET | Freq: Every day | ORAL | Status: DC
  Administered 2015-08-29 – 2015-09-02 (×5): 75 mg via ORAL
  Filled 2015-08-29 (×5): qty 1

## 2015-08-29 MED ORDER — INSULIN GLARGINE 100 UNIT/ML ~~LOC~~ SOLN
5.0000 [IU] | Freq: Every day | SUBCUTANEOUS | Status: DC
  Administered 2015-08-29 – 2015-09-01 (×4): 5 [IU] via SUBCUTANEOUS
  Filled 2015-08-29 (×5): qty 0.05

## 2015-08-29 MED ORDER — ASPIRIN 81 MG PO CHEW
81.0000 mg | CHEWABLE_TABLET | Freq: Every day | ORAL | Status: DC
  Administered 2015-08-30 – 2015-09-02 (×4): 81 mg via ORAL
  Filled 2015-08-29 (×4): qty 1

## 2015-08-29 MED ORDER — MAGNESIUM SULFATE 4 GM/100ML IV SOLN
4.0000 g | Freq: Once | INTRAVENOUS | Status: AC
  Administered 2015-08-29: 4 g via INTRAVENOUS
  Filled 2015-08-29: qty 100

## 2015-08-29 MED ORDER — ENOXAPARIN SODIUM 40 MG/0.4ML ~~LOC~~ SOLN
40.0000 mg | SUBCUTANEOUS | Status: DC
  Administered 2015-08-30 – 2015-08-31 (×2): 40 mg via SUBCUTANEOUS
  Filled 2015-08-29 (×2): qty 0.4

## 2015-08-29 MED ORDER — SODIUM CHLORIDE 0.9 % IV SOLN
INTRAVENOUS | Status: DC
  Administered 2015-08-29 – 2015-08-30 (×2): via INTRAVENOUS

## 2015-08-29 MED ORDER — INSULIN ASPART 100 UNIT/ML ~~LOC~~ SOLN
0.0000 [IU] | Freq: Three times a day (TID) | SUBCUTANEOUS | Status: DC
  Administered 2015-08-29 (×2): 3 [IU] via SUBCUTANEOUS
  Administered 2015-08-29: 11 [IU] via SUBCUTANEOUS
  Administered 2015-08-30 (×2): 3 [IU] via SUBCUTANEOUS
  Administered 2015-08-30 – 2015-08-31 (×2): 8 [IU] via SUBCUTANEOUS
  Administered 2015-08-31: 3 [IU] via SUBCUTANEOUS
  Administered 2015-08-31 – 2015-09-01 (×2): 8 [IU] via SUBCUTANEOUS
  Administered 2015-09-01 (×2): 5 [IU] via SUBCUTANEOUS
  Administered 2015-09-02: 2 [IU] via SUBCUTANEOUS
  Administered 2015-09-02: 5 [IU] via SUBCUTANEOUS

## 2015-08-29 NOTE — Progress Notes (Signed)
PT Cancellation Note  Patient Details Name: Johnny Murphy MRN: [id number] DOB: 02-26-1933   Cancelled Treatment:    Reason Eval/Treat Not Completed: Patient at procedure or test/unavailable. Pt off floor at procedure. PT to return as able.   Kingsley Callander 08/29/2015, 8:16 AM   Kittie Plater, PT, DPT Pager #: [phone] Office #: [phone]

## 2015-08-29 NOTE — Evaluation (Signed)
Physical Therapy Evaluation Patient Details Name: Johnny Murphy MRN: [id number] DOB: May 04, 1933 Today's Date: 08/29/2015   History of Present Illness  Johnny Murphy is a 82 y.o. male with a past medical history of hypertension, carotid artery stenosis, diabetes, hyperlipidemia, hypothyroidism, who is being admitted to the neurology floor after presenting to [facility] via EMS due to syncope.  Clinical Impression  Pt admitted with above. Pt functioning near baseline. Pt with moderate falls risk at demo'd by score of 16 on DGI. Pt also with h/o falls. Recommend Outpt PT to address balance to decrease falls risk.    Follow Up Recommendations Outpatient PT;Supervision - Intermittent    Equipment Recommendations  None recommended by PT    Recommendations for Other Services       Precautions / Restrictions Precautions Precautions: Fall Restrictions Weight Bearing Restrictions: No      Mobility  Bed Mobility Overal bed mobility: Needs Assistance Bed Mobility: Supine to Sit     Supine to sit: Supervision     General bed mobility comments: pt with no difficulties, HOB elevated and use of bed rails  Transfers Overall transfer level: Needs assistance Equipment used: None Transfers: Sit to/from Stand Sit to Stand: Min guard         General transfer comment: pt with good technique, pt min guard due to syncopal history  Ambulation/Gait Ambulation/Gait assistance: Min guard Ambulation Distance (Feet): 300 Feet Assistive device: None Gait Pattern/deviations: Step-through pattern;Staggering left;Staggering right Gait velocity: dec Gait velocity interpretation: Below normal speed for age/gender General Gait Details: pt no overt episodes of LOB but mildly unsteady, pt reaching for rail in hallway  Stairs Stairs: Yes Stairs assistance: Min guard Stair Management: One rail Right Number of Stairs: 4 General stair comments: safe technique  Wheelchair Mobility     Modified Rankin (Stroke Patients Only) Modified Rankin (Stroke Patients Only) Pre-Morbid Rankin Score: No symptoms Modified Rankin: Moderate disability     Balance                                 Standardized Balance Assessment Standardized Balance Assessment : Dynamic Gait Index   Dynamic Gait Index Level Surface: Mild Impairment Change in Gait Speed: Mild Impairment Gait with Horizontal Head Turns: Mild Impairment Gait with Vertical Head Turns: Mild Impairment Gait and Pivot Turn: Mild Impairment Step Over Obstacle: Mild Impairment Step Around Obstacles: Mild Impairment Steps: Mild Impairment Total Score: 16       Pertinent Vitals/Pain Pain Assessment: No/denies pain    Home Living Family/patient expects to be discharged to:: Private residence Living Arrangements: Spouse/significant other Available Help at Discharge: Family;Available 24 hours/day Type of Home: House Home Access: Stairs to enter Entrance Stairs-Rails: Right Entrance Stairs-Number of Steps: 3 Home Layout: One level;Laundry or work area in [employer]: None      Prior Function Level of Independence: Independent         Comments: pt was gardening and driving. per spouse pt has fallen a few times     Hand Dominance   Dominant Hand: Right    Extremity/Trunk Assessment   Upper Extremity Assessment: Overall WFL for tasks assessed           Lower Extremity Assessment: Overall WFL for tasks assessed      Cervical / Trunk Assessment: Normal  Communication   Communication: No difficulties  Cognition Arousal/Alertness: Awake/alert Behavior During Therapy: WFL for tasks assessed/performed Overall Cognitive  Status: Within Functional Limits for tasks assessed                      General Comments      Exercises        Assessment/Plan    PT Assessment Patient needs continued PT services  PT Diagnosis Difficulty walking   PT Problem List  Decreased strength;Decreased activity tolerance;Decreased balance;Decreased mobility  PT Treatment Interventions DME instruction;Gait training;Stair training;Functional mobility training;Therapeutic activities;Therapeutic exercise   PT Goals (Current goals can be found in the Care Plan section) Acute Rehab PT Goals Patient Stated Goal: home PT Goal Formulation: With patient Time For Goal Achievement: 09/05/15 Potential to Achieve Goals: Good Additional Goals Additional Goal #1: Pt to score >41 on Berg to indicate minimal falls risk.    Frequency Min 3X/week   Barriers to discharge        Co-evaluation               End of Session Equipment Utilized During Treatment: Gait belt Activity Tolerance: Patient tolerated treatment well Patient left: in chair;with call bell/phone within reach;with chair alarm set Nurse Communication: Mobility status    Functional Assessment Tool Used: clinical judgement Functional Limitation: Mobility: Walking and moving around Mobility: Walking and Moving Around Current Status [id number]): At least 1 percent but less than 20 percent impaired, limited or restricted Mobility: Walking and Moving Around Goal Status [phone]): At least 1 percent but less than 20 percent impaired, limited or restricted    Time: 1258-1315 PT Time Calculation (min) (ACUTE ONLY): 17 min   Charges:   PT Evaluation $PT Eval Low Complexity: 1 Procedure     PT G Codes:   PT G-Codes **NOT FOR INPATIENT CLASS** Functional Assessment Tool Used: clinical judgement Functional Limitation: Mobility: Walking and moving around Mobility: Walking and Moving Around Current Status [id number]): At least 1 percent but less than 20 percent impaired, limited or restricted Mobility: Walking and Moving Around Goal Status [phone]): At least 1 percent but less than 20 percent impaired, limited or restricted    Kingsley Callander 08/29/2015, 1:57 PM   Kittie Plater, PT, DPT Pager #:  [phone] Office #: [phone]

## 2015-08-29 NOTE — Progress Notes (Signed)
Received call from [place] pt in 1st degree HB; at 5:45 am 2nd degree type 1 HB.   At 7:16 am 1.82 second pause.  Text message sent to Curly Rim

## 2015-08-29 NOTE — Progress Notes (Signed)
Interval History:                                                                                                                      Johnny Murphy is an 82 y.o. male patient admitted for recurrent syncope evaluation. No new neuro sx overnight.   Past Medical History: Past Medical History  Diagnosis Date  . Diabetes mellitus   . Hypertension   . Myocardial infarction ([place])   . Arthritis   . Carotid stenosis, asymptomatic 12/02/2010    Past Surgical History  Procedure Laterality Date  . Trapease ivc filter  05/01/2002    conditional 5 up to 3T Mri     Family History: History reviewed. No pertinent family history.  Social History:   reports that he has quit smoking. His smoking use included Cigarettes. He quit smokeless tobacco use about 37 years ago. His alcohol and drug histories are not on file.  Allergies:  Allergies  Allergen Reactions  . Statins Other (See Comments)    Joint pain     Medications:                                                                                                                         Current facility-administered medications:  .  0.9 %  sodium chloride infusion, , Intravenous, Continuous, Bonnielee Haff, MD, Last Rate: 75 mL/hr at 08/29/15 1016 .  [START ON 08/30/2015] aspirin chewable tablet 81 mg, 81 mg, Oral, Daily, Dmarco Baldus Fuller Mandril, MD .  clopidogrel (PLAVIX) tablet 75 mg, 75 mg, Oral, Daily, Manu Rubey Fuller Mandril, MD .  enoxaparin (LOVENOX) injection 40 mg, 40 mg, Subcutaneous, Q24H, Reubin Milan, MD, 40 mg at 08/28/15 2236 .  glipiZIDE (GLUCOTROL XL) 24 hr tablet 10 mg, 10 mg, Oral, BID WC, Reubin Milan, MD, 10 mg at 08/29/15 1016 .  insulin aspart (novoLOG) injection 0-15 Units, 0-15 Units, Subcutaneous, TID WC, Reubin Milan, MD, 11 Units at 08/29/15 [phone] .  insulin glargine (LANTUS) injection 10 Units, 10 Units, Subcutaneous, QHS, Reubin Milan, MD, 10 Units at 08/28/15 2236 .  levothyroxine  (SYNTHROID, LEVOTHROID) tablet 25 mcg, 25 mcg, Oral, QAC breakfast, Reubin Milan, MD, 25 mcg at 08/29/15 1015 .  linagliptin (TRADJENTA) tablet 5 mg, 5 mg, Oral, Daily, Reubin Milan, MD, 5 mg at 08/29/15 1015 .  ondansetron (ZOFRAN) tablet 4 mg, 4 mg, Oral, Q6H PRN **OR** ondansetron (ZOFRAN) injection 4 mg, 4 mg, Intravenous,  Q6H PRN, Reubin Milan, MD .  pantoprazole (PROTONIX) EC tablet 40 mg, 40 mg, Oral, Daily, Reubin Milan, MD, 40 mg at 08/29/15 1015 .  pravastatin (PRAVACHOL) tablet 20 mg, 20 mg, Oral, QHS, Reubin Milan, MD, 20 mg at 08/28/15 2237 .  senna-docusate (Senokot-S) tablet 1 tablet, 1 tablet, Oral, QHS PRN, Reubin Milan, MD .  sodium chloride flush (NS) 0.9 % injection 3 mL, 3 mL, Intravenous, Q12H, Reubin Milan, MD, 3 mL at 08/28/15 2237   Neurologic Examination:                                                                                                     Today's Vitals   08/29/15 0000 08/29/15 0200 08/29/15 0400 08/29/15 0600  BP: 170/69 123/60 131/59 131/54  Pulse: 62 69 64 72  Temp: 98.2 F (36.8 C) 98 F (36.7 C) 98 F (36.7 C) 98.3 F (36.8 C)  TempSrc: Oral Oral Oral Oral  Resp: 16 16 16 16   Height:      Weight:      SpO2: 98% 97% 98% 98%    Evaluation of higher integrative functions including: Level of alertness: Alert,  Oriented to time, place and person Speech: fluent, no evidence of dysarthria or aphasia noted.  Test the following cranial nerves: 2-12 grossly intact Motor examination: Normal tone, bulk, full 5/5 motor strength in all 4 extremities Examination of sensation : Normal and symmetric sensation to pinprick in all 4 extremities and on face Examination of deep tendon reflexes: 2+, normal and symmetric in all extremities, normal plantars bilaterally Test coordination: Normal finger nose testing, with no evidence of limb appendicular ataxia or abnormal involuntary movements or tremors noted.  Gait:  wide based, otherwise wnl.    Lab Results: Basic Metabolic Panel:  Recent Labs Lab 08/28/15 2037 08/29/15 0530  NA  --  132*  K  --  4.2  CL  --  99*  CO2  --  23  GLUCOSE  --  347*  BUN  --  25*  CREATININE  --  1.49*  CALCIUM  --  8.9  MG 1.5*  --   PHOS 3.6  --     Liver Function Tests:  Recent Labs Lab 08/29/15 0530  AST 31  ALT 26  ALKPHOS 77  BILITOT 0.7  PROT 5.5*  ALBUMIN 3.0*   No results for input(s): LIPASE, AMYLASE in the last 168 hours. No results for input(s): AMMONIA in the last 168 hours.  CBC:  Recent Labs Lab 08/29/15 0530  WBC 7.4  NEUTROABS 4.4  HGB 12.0*  HCT 35.2*  MCV 91.9  PLT 173    Cardiac Enzymes:  Recent Labs Lab 08/28/15 2037 08/29/15 0530  TROPONINI 0.03 0.03    Lipid Panel:  Recent Labs Lab 08/29/15 0530  CHOL 180  TRIG 274*  HDL 30*  CHOLHDL 6.0  VLDL 55*  LDLCALC 95    CBG:  Recent Labs Lab 08/28/15 1812 08/28/15 2228 08/29/15 0647  GLUCAP 272* 306* 312*    Microbiology: No results found  for this or any previous visit.  Imaging: No results found.  Assessment and plan:   Johnny Murphy is an 82 y.o. male patient with recurrent syncope. 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  MRI brain showed no acute stroke.  Given the recurrent syncopal events, which I suspect is from severe vertebrobasilar disease, recommend further evaluation with a cerebral arteriogram, mainly to evaluate collateral flow and if candidate for any intervention.  D/W Dr. Estanislado Pandy. NPO after midnight. Planned arteriogram tomor am.  Placed on ASA and plavix, D/W Dr. [place] Pink.  Will f/u.

## 2015-08-29 NOTE — Evaluation (Signed)
Occupational Therapy Evaluation Patient Details Name: Johnny Murphy MRN: [id number] DOB: 03/28/1933 Today's Date: 08/29/2015    History of Present Illness Johnny Murphy is a 82 y.o. male with a past medical history of hypertension, carotid artery stenosis, diabetes, hyperlipidemia, hypothyroidism, who is being admitted to the neurology floor after presenting to [facility] via EMS due to syncope.   Clinical Impression   Pt is functioning at a supervision level in ADL and ADL transfers due to hx of syncope. Pt has all necessary equipment at home. Pt has both a walk-in and a tub shower at home, recommended pt use walk-in. Pt agreeable to use of cane when outside on uneven surfaces as he has a hx of falls outside. Agree with plan for OPPT to address balance. No further OT needs.   Follow Up Recommendations  No OT follow up    Equipment Recommendations  None recommended by OT    Recommendations for Other Services       Precautions / Restrictions Precautions Precautions: Fall Restrictions Weight Bearing Restrictions: No      Mobility Bed Mobility      General bed mobility comments: pt in chair, choosing to stay up in chair  Transfers Overall transfer level: Needs assistance Equipment used: None Transfers: Sit to/from Stand Sit to Stand: Supervision         General transfer comment: supervised for safety    Balance                                       ADL Overall ADL's : Needs assistance/impaired Eating/Feeding: Independent;Sitting   Grooming: Wash/dry hands;Standing;Supervision/safety   Upper Body Bathing: Set up;Sitting   Lower Body Bathing: Supervison/ safety;Sit to/from stand   Upper Body Dressing : Set up;Sitting   Lower Body Dressing: Supervision/safety;Sit to/from stand Lower Body Dressing Details (indicate cue type and reason): no difficulty donning socks Toilet Transfer: Min guard;Ambulation;Comfort height toilet    Toileting- Clothing Manipulation and Hygiene: Supervision/safety;Sit to/from stand       Functional mobility during ADLs: Min guard due to hx of syncope General ADL Comments: Able to retrieve items from floor without LOB.     Vision     Perception     Praxis      Pertinent Vitals/Pain Pain Assessment: No/denies pain     Hand Dominance Right   Extremity/Trunk Assessment Upper Extremity Assessment Upper Extremity Assessment: Overall WFL for tasks assessed   Lower Extremity Assessment Lower Extremity Assessment: Overall WFL for tasks assessed   Cervical / Trunk Assessment Cervical / Trunk Assessment: Normal   Communication Communication Communication: No difficulties   Cognition Arousal/Alertness: Awake/alert Behavior During Therapy: WFL for tasks assessed/performed Overall Cognitive Status: Within Functional Limits for tasks assessed                     General Comments       Exercises       Shoulder Instructions      Home Living Family/patient expects to be discharged to:: Private residence Living Arrangements: Spouse/significant other Available Help at Discharge: Family;Available 24 hours/day Type of Home: House Home Access: Stairs to enter [employer] of Steps: 3 Entrance Stairs-Rails: Right Home Layout: One level;Laundry or work area in [employer] Shower/Tub: Tub/shower unit;Walk-in shower   Bathroom Toilet: Standard (has both)     Home Equipment: Shower seat - built  in;Grab bars - tub/shower;Cane - single point   Additional Comments: uses grab bar as he steps in and out of tub, no other equipment      Prior Functioning/Environment Level of Independence: Independent        Comments: pt was gardening and driving, report having fallen in the yard recently without injury    OT Diagnosis:  (syncope)   OT Problem List:     OT Treatment/Interventions:      OT Goals(Current goals can be found in the care plan  section) Acute Rehab OT Goals Patient Stated Goal: home  OT Frequency:     Barriers to D/C:            Co-evaluation              End of Session Equipment Utilized During Treatment: Gait belt  Activity Tolerance: Patient tolerated treatment well Patient left: in chair;with call bell/phone within reach;with chair alarm set;with family/visitor present   Time: 1419-1435 OT Time Calculation (min): 16 min Charges:  OT General Charges $OT Visit: 1 Procedure OT Evaluation $OT Eval Low Complexity: 1 Procedure G-Codes: OT G-codes **NOT FOR INPATIENT CLASS** Functional Assessment Tool Used: clinical judgement Functional Limitation: Self care Self Care Current Status [id number]): At least 1 percent but less than 20 percent impaired, limited or restricted Self Care Goal Status [id number]): At least 1 percent but less than 20 percent impaired, limited or restricted Self Care Discharge Status [phone]): At least 1 percent but less than 20 percent impaired, limited or restricted  Johnny Murphy 08/29/2015, 2:46 PM [phone]

## 2015-08-29 NOTE — Progress Notes (Signed)
[facility] PROGRESS NOTE  Johnny Murphy [id number] DOB: 1933/04/17 DOA: 08/28/2015  PCP: Gilford Rile, MD  Brief HPI: 82 year old Caucasian male with a past medical history of mild dementia, hypertension, carotid artery stenosis, diabetes, hypothyroidism, was eating breakfast at a restaurant when he suddenly passed out. EMS noted that he had left-sided weakness. The symptoms resolved after he came into the emergency department. Apparently, patient was unresponsive for almost 45 minutes per family.  Past medical history:  Past Medical History  Diagnosis Date  . Diabetes mellitus   . Hypertension   . Myocardial infarction ([place])   . Arthritis   . Carotid stenosis, asymptomatic 12/02/2010    Consultants: Neurology  Procedures:  Transthoracic echocardiogram is pending  Antibiotics: None  Subjective: Patient denies any complaints this morning. He feels like he is back to his normal. His wife is at the bedside.  Objective: Vital Signs  Filed Vitals:   08/29/15 0000 08/29/15 0200 08/29/15 0400 08/29/15 0600  BP: 170/69 123/60 131/59 131/54  Pulse: 62 69 64 72  Temp: 98.2 F (36.8 C) 98 F (36.7 C) 98 F (36.7 C) 98.3 F (36.8 C)  TempSrc: Oral Oral Oral Oral  Resp: 16 16 16 16   Height:      Weight:      SpO2: 98% 97% 98% 98%   No intake or output data in the 24 hours ending 08/29/15 1131 Filed Weights   08/28/15 1817  Weight: 78.926 kg (174 lb)    General appearance: alert, cooperative, appears stated age and no distress Throat: lips, mucosa, and tongue normal; teeth and gums normal Resp: clear to auscultation bilaterally Cardio: regular rate and rhythm, S1, S2 normal, no murmur, click, rub or gallop. Bruit heard over the left carotid GI: soft, non-tender; bowel sounds normal; no masses,  no organomegaly Extremities: extremities normal, atraumatic, no cyanosis or edema Neurologic: Awake and alert. Oriented. Cranial nerves II-12 intact. Motor strength  equal. Normal bilateral upper and lower extremities. No pronator drift.  Lab Results:  Basic Metabolic Panel:  Recent Labs Lab 08/28/15 2037 08/29/15 0530  NA  --  132*  K  --  4.2  CL  --  99*  CO2  --  23  GLUCOSE  --  347*  BUN  --  25*  CREATININE  --  1.49*  CALCIUM  --  8.9  MG 1.5*  --   PHOS 3.6  --    Liver Function Tests:  Recent Labs Lab 08/29/15 0530  AST 31  ALT 26  ALKPHOS 77  BILITOT 0.7  PROT 5.5*  ALBUMIN 3.0*   CBC:  Recent Labs Lab 08/29/15 0530  WBC 7.4  NEUTROABS 4.4  HGB 12.0*  HCT 35.2*  MCV 91.9  PLT 173   Cardiac Enzymes:  Recent Labs Lab 08/28/15 2037 08/29/15 0530  TROPONINI 0.03 0.03   CBG:  Recent Labs Lab 08/28/15 1812 08/28/15 2228 08/29/15 0647  GLUCAP 272* 306* 312*      Studies/Results: Mr Brain Wo Contrast  08/29/2015  CLINICAL DATA:  Syncopal episode yesterday. EXAM: MRI HEAD WITHOUT CONTRAST TECHNIQUE: Multiplanar, multiecho pulse sequences of the brain and surrounding structures were obtained without intravenous contrast. COMPARISON:  CTA of the head and neck 08/28/2015. FINDINGS: The diffusion-weighted images demonstrate no evidence for acute or subacute infarction. Acute hemorrhage or mass lesion is present. Remote lacunar infarcts are present within the basal ganglia and cerebellum bilaterally Moderate periventricular white matter changes are present bilaterally. Focal remote nonhemorrhagic cortical infarcts  are present in the right parietal lobe. The internal auditory canals are within normal limits bilaterally. White matter changes extend into the brainstem. A remote lacunar infarct is present in the left thalamus. Moderate generalized atrophy is present. No significant extra-axial fluid collection is present. Abnormal signal in the right vertebral artery below the dural margin and within the cavernous right internal carotid artery compatible with occlusion is identified on the previous study. Flow is  otherwise present in the major intracranial arteries. The skullbase is within normal limits. Midline sagittal images are unremarkable. IMPRESSION: 1. No acute intracranial abnormality. 2. Multiple remote lacunar infarcts of the basal ganglia and cerebellum bilaterally. 3. Remote small cortical infarcts of the right parietal lobe. 4. Abnormal signal within the cavernous right internal carotid artery and the right vertebral artery compatible with occlusions identified on the previous study. Flow is present within the right M1 segment. Electronically Signed   By: San Morelle M.D.   On: 08/29/2015 10:31    Medications:  Scheduled: . [START ON 08/30/2015] aspirin  81 mg Oral Daily  . clopidogrel  75 mg Oral Daily  . enoxaparin (LOVENOX) injection  40 mg Subcutaneous Q24H  . glipiZIDE  10 mg Oral BID WC  . insulin aspart  0-15 Units Subcutaneous TID WC  . insulin glargine  10 Units Subcutaneous QHS  . levothyroxine  25 mcg Oral QAC breakfast  . linagliptin  5 mg Oral Daily  . pantoprazole  40 mg Oral Daily  . pravastatin  20 mg Oral QHS  . sodium chloride flush  3 mL Intravenous Q12H   Continuous: . sodium chloride 75 mL/hr at 08/29/15 1016   [id number] **OR** ondansetron (ZOFRAN) IV, senna-docusate  Assessment/Plan:  Principal Problem:   TIA (transient ischemic attack) Active Problems:   HTN (hypertension)   Carotid stenosis   DM type 2 (diabetes mellitus, type 2) ([place])   Hyperlipidemia   Hypothyroidism    Syncope Etiology is unclear. Telemetry shows first degree AV block. Repeat episode of her type I second-degree AV block. One Episode of pause lasting 1.8 s noted. Patient was on beta blocker at home. This has been held. Echocardiogram is pending. MRI brain does not show any stroke. Patient apparently underwent CT angiogram of his head and neck at the outside facility. Report is not available at this time. However, neurologist did find out that it did showed vertebral  artery stenosis bilaterally, as well as carotid artery stenosis. In view of this neurologist recommends arteriogram which will be done tomorrow. Patient is currently on aspirin and Plavix.  History of carotid artery stenosis Carotid Doppler has been discontinued by neurology. CT angiogram report from outside facilities pending but apparently shows significant stenosis as discussed above. Arteriogram tomorrow.  Essential hypertension Monitor blood pressures closely. Holding his oral agents for now.  Type 2 diabetes Poorly controlled. CBGs are elevated. Decreased dose of Lantus. HbA1c is pending. Since he'll be nothing by mouth past midnight. We will hold his oral agents for now.  Hypothyroidism Continue his home medications.  History of hyperlipidemia Continue his home medication  Mildly elevated BUN and creatinine Baseline renal function is unknown. He could have chronic kidney disease. Continue gentle IV hydration. Peak labs tomorrow.  DVT Prophylaxis: Lovenox    Code Status: Full code  Family Communication: Discussed with the patient  Disposition Plan: Arteriogram tomorrow. Echocardiogram is pending. PT and OT evaluation.    LOS: 1 day   [facility] Pager [phone] 08/29/2015, 11:31 AM  If 7PM-7AM, please contact night-coverage at [email or web address], password [facility]

## 2015-08-29 NOTE — Progress Notes (Signed)
CTA report faxed in from [facility]; placed in chart.

## 2015-08-30 ENCOUNTER — Other Ambulatory Visit (HOSPITAL_COMMUNITY): Payer: Commercial Indemnity

## 2015-08-30 ENCOUNTER — Observation Stay (HOSPITAL_COMMUNITY): Payer: Medicare Other

## 2015-08-30 DIAGNOSIS — Z794 Long term (current) use of insulin: Secondary | ICD-10-CM | POA: Diagnosis not present

## 2015-08-30 DIAGNOSIS — E039 Hypothyroidism, unspecified: Secondary | ICD-10-CM | POA: Diagnosis not present

## 2015-08-30 DIAGNOSIS — I10 Essential (primary) hypertension: Secondary | ICD-10-CM | POA: Diagnosis not present

## 2015-08-30 DIAGNOSIS — E118 Type 2 diabetes mellitus with unspecified complications: Secondary | ICD-10-CM | POA: Diagnosis not present

## 2015-08-30 DIAGNOSIS — G459 Transient cerebral ischemic attack, unspecified: Secondary | ICD-10-CM | POA: Diagnosis not present

## 2015-08-30 LAB — CBC
HEMATOCRIT: 36.1 % — AB (ref 39.0–52.0)
HEMOGLOBIN: 12.3 g/dL — AB (ref 13.0–17.0)
MCH: 31.4 pg (ref 26.0–34.0)
MCHC: 34.1 g/dL (ref 30.0–36.0)
MCV: 92.1 fL (ref 78.0–100.0)
Platelets: 163 10*3/uL (ref 150–400)
RBC: 3.92 MIL/uL — AB (ref 4.22–5.81)
RDW: 12.7 % (ref 11.5–15.5)
WBC: 7.6 10*3/uL (ref 4.0–10.5)

## 2015-08-30 LAB — BASIC METABOLIC PANEL
ANION GAP: 10 (ref 5–15)
BUN: 23 mg/dL — ABNORMAL HIGH (ref 6–20)
CHLORIDE: 101 mmol/L (ref 101–111)
CO2: 25 mmol/L (ref 22–32)
CREATININE: 1.56 mg/dL — AB (ref 0.61–1.24)
Calcium: 9.2 mg/dL (ref 8.9–10.3)
GFR calc non Af Amer: 40 mL/min — ABNORMAL LOW (ref >60)
GFR, EST AFRICAN AMERICAN: 47 mL/min — AB (ref >60)
Glucose, Bld: 178 mg/dL — ABNORMAL HIGH (ref 65–99)
POTASSIUM: 4.3 mmol/L (ref 3.5–5.1)
SODIUM: 136 mmol/L (ref 135–145)

## 2015-08-30 LAB — APTT: aPTT: 27 seconds (ref 24–37)

## 2015-08-30 LAB — GLUCOSE, CAPILLARY
GLUCOSE-CAPILLARY: 168 mg/dL — AB (ref 65–99)
GLUCOSE-CAPILLARY: 267 mg/dL — AB (ref 65–99)
GLUCOSE-CAPILLARY: 158 mg/dL — AB (ref 65–99)
Glucose-Capillary: 173 mg/dL — ABNORMAL HIGH (ref 65–99)

## 2015-08-30 LAB — HEMOGLOBIN A1C
HEMOGLOBIN A1C: 11.6 % — AB (ref 4.8–5.6)
MEAN PLASMA GLUCOSE: 286 mg/dL

## 2015-08-30 LAB — PROTIME-INR
INR: 1.05 (ref 0.00–1.49)
Prothrombin Time: 13.9 seconds (ref 11.6–15.2)

## 2015-08-30 MED ORDER — IOPAMIDOL (ISOVUE-300) INJECTION 61%
INTRAVENOUS | Status: AC
  Administered 2015-08-30: 20 mL
  Filled 2015-08-30: qty 50

## 2015-08-30 MED ORDER — HEPARIN SOD (PORK) LOCK FLUSH 100 UNIT/ML IV SOLN
INTRAVENOUS | Status: AC | PRN
  Administered 2015-08-30: 1000 [IU] via INTRAVENOUS

## 2015-08-30 MED ORDER — LIDOCAINE HCL 1 % IJ SOLN
INTRAMUSCULAR | Status: AC
  Filled 2015-08-30: qty 20

## 2015-08-30 MED ORDER — SODIUM CHLORIDE 0.9 % IV SOLN
INTRAVENOUS | Status: AC | PRN
  Administered 2015-08-30: 10 mL/h via INTRAVENOUS

## 2015-08-30 MED ORDER — SODIUM CHLORIDE 0.9 % IV BOLUS (SEPSIS)
250.0000 mL | Freq: Once | INTRAVENOUS | Status: AC
  Administered 2015-08-30: 250 mL via INTRAVENOUS

## 2015-08-30 MED ORDER — SODIUM CHLORIDE 0.9 % IV SOLN: INTRAVENOUS | Status: AC

## 2015-08-30 MED ORDER — FENTANYL CITRATE (PF) 100 MCG/2ML IJ SOLN
INTRAMUSCULAR | Status: AC
  Filled 2015-08-30: qty 2

## 2015-08-30 MED ORDER — HEPARIN SOD (PORK) LOCK FLUSH 100 UNIT/ML IV SOLN
INTRAVENOUS | Status: AC
  Filled 2015-08-30: qty 20

## 2015-08-30 MED ORDER — MIDAZOLAM HCL 2 MG/2ML IJ SOLN
INTRAMUSCULAR | Status: AC
  Filled 2015-08-30: qty 2

## 2015-08-30 MED ORDER — HYDRALAZINE HCL 20 MG/ML IJ SOLN: 5.0000 mg | Freq: Four times a day (QID) | INTRAMUSCULAR | Status: DC | PRN

## 2015-08-30 MED ORDER — IOPAMIDOL (ISOVUE-300) INJECTION 61%
INTRAVENOUS | Status: AC
  Administered 2015-08-30: 80 mL
  Filled 2015-08-30: qty 150

## 2015-08-30 MED ORDER — MIDAZOLAM HCL 2 MG/2ML IJ SOLN
INTRAMUSCULAR | Status: AC | PRN
  Administered 2015-08-30: 1 mg via INTRAVENOUS

## 2015-08-30 MED ORDER — FENTANYL CITRATE (PF) 100 MCG/2ML IJ SOLN
INTRAMUSCULAR | Status: AC | PRN
  Administered 2015-08-30 (×3): 25 ug via INTRAVENOUS

## 2015-08-30 NOTE — Evaluation (Addendum)
Speech Language Pathology Evaluation Patient Details Name: Johnny Murphy MRN: [id number] DOB: 03-Mar-1933 Today's Date: 08/30/2015 Time: [id number] SLP Time Calculation (min) (ACUTE ONLY): 29 min  Problem List:  Patient Active Problem List   Diagnosis Date Noted  . TIA (transient ischemic attack) 08/28/2015  . Carotid stenosis 08/28/2015  . DM type 2 (diabetes mellitus, type 2) ([place]) 08/28/2015  . Hyperlipidemia 08/28/2015  . Hypothyroidism 08/28/2015  . Carotid stenosis, asymptomatic 12/02/2010  . 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 Osteoarthritis of right knee 12/02/2010  . HTN (hypertension) 12/02/2010   Past Medical History:  Past Medical History  Diagnosis Date  . Diabetes mellitus   . Hypertension   . Myocardial infarction ([place])   . Arthritis   . Carotid stenosis, asymptomatic 12/02/2010   Past Surgical History:  Past Surgical History  Procedure Laterality Date  . Trapease ivc filter  05/01/2002    conditional 5 up to 3T Mri    HPI:  82 year old Caucasian male with a past medical history of mild dementia, hypertension, carotid artery stenosis, diabetes, hypothyroidism, was eating breakfast at a restaurant when he suddenly passed out. EMS noted that he had left-sided weakness. The symptoms resolved after he came into the emergency department.82 year old Caucasian male with a past medical history of mild dementia, hypertension, carotid artery stenosis, diabetes, hypothyroidism, was eating breakfast at a restaurant when he suddenly passed out. EMS noted that he had left-sided weakness. The symptoms resolved after he came into the emergency department.   Assessment / Plan / Recommendation Clinical Impression  Pt presents within functional limits for all areas assessed with the exception of decreased short term memory/memory for recall of new information. Pt was able to demonstrate general recall of pertinent medical  information from this hospitalization, but demonstrated difficulty with recall of specific details. For example, "They are considering doing another procedure tomorrow, but I can't remember what it's called." No other deficits noted and these deficits appear to be consistent with baseline mild dementia. Montreal Cognitive Assessment score 27/30. Speech and language are WNL.  No additional SLP services indicated at this time.     SLP Assessment  Patient does not need any further Speech Lanaguage Pathology Services    Follow Up Recommendations  None    Frequency and Duration           SLP Evaluation Prior Functioning  Cognitive/Linguistic Baseline: Baseline deficits Baseline deficit details: mild dementia  Lives With: Spouse   Cognition  Overall Cognitive Status: History of cognitive impairments - at baseline Arousal/Alertness: Awake/alert Orientation Level: Oriented X4 (generally) Memory: Impaired Memory Impairment: Decreased recall of new information Awareness: Appears intact Problem Solving: Appears intact Safety/Judgment: Appears intact    Comprehension  Auditory Comprehension Overall Auditory Comprehension: Appears within functional limits for tasks assessed Reading Comprehension Reading Status: Within funtional limits    Expression Expression Primary Mode of Expression: Verbal Verbal Expression Overall Verbal Expression: Appears within functional limits for tasks assessed Written Expression Written Expression: Not tested   Oral / Motor  Oral Motor/Sensory Function Overall Oral Motor/Sensory Function: Within functional limits Motor Speech Overall Motor Speech: Appears within functional limits for tasks assessed   GO          Functional Assessment Tool Used: Clinician judgement Functional Limitations: Memory Memory Current Status [id number]): At least 20 percent but less than 40 percent impaired, limited or restricted Memory Goal Status [id number]): At least 20 percent but  less than 40 percent impaired,  limited or restricted Memory Discharge Status [phone]): At least 20 percent but less than 40 percent impaired, limited or restricted         Vinetta Bergamo MA, [place] pgr [phone] 08/30/2015, 1:12 PM

## 2015-08-30 NOTE — Sedation Documentation (Signed)
ETCO2 removed per Dr. Deveshwar 

## 2015-08-30 NOTE — Consult Note (Signed)
Chief Complaint: Patient was seen in consultation today for cerebral arteriogram at the request of Dr Silverio Decamp  Referring Physician(s): Dr Audria Nine  Supervising Physician: Luanne Bras  History of Present Illness: Johnny Murphy is a 82 y.o. male   Pt with recurrent syncope Presented after passing out in a restaurant Left sided weakness Neuro work up per Dr Silverio Decamp 4/9: MR Brain: IMPRESSION: 1. No acute intracranial abnormality. 2. Multiple remote lacunar infarcts of the basal ganglia and cerebellum bilaterally. 3. Remote small cortical infarcts of the right parietal lobe. 4. Abnormal signal within the cavernous right internal carotid artery and the right vertebral artery compatible with occlusions identified on the previous study. Flow is present within the right M1 segment.  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 Diabetes mellitus   . Hypertension   . Myocardial infarction ([place])   . Arthritis   . Carotid stenosis, asymptomatic 12/02/2010    Past Surgical History  Procedure Laterality Date  . Trapease ivc filter  05/01/2002    conditional 5 up to 3T Mri     Allergies: Statins  Medications: Prior to Admission medications   Medication Sig Start Date End Date Taking? Authorizing Provider  amLODipine (NORVASC) 5 MG tablet Take 5 mg by mouth daily.  09/19/10  Yes Historical Provider, MD  aspirin 81 MG tablet Take 81 mg by mouth daily.   Yes Historical Provider, MD  carvedilol (COREG) 25 MG tablet Take 12.5 mg by mouth 2 (two) times daily with a meal.  11/04/10  Yes Historical Provider, MD  cholecalciferol (VITAMIN D) 1000 units tablet Take 1,000 Units by mouth daily.   Yes Historical Provider, MD  colchicine 0.6 MG  tablet Take 1 tablet by mouth daily as needed. 07/07/15  Yes Historical Provider, MD  GLIPIZIDE XL 10 MG 24 hr tablet Take 10 mg by mouth daily with breakfast.  09/08/10  Yes Historical Provider, MD  insulin glargine (LANTUS) 100 UNIT/ML injection Inject 10 Units into the skin at bedtime.   Yes Historical Provider, MD  levothyroxine (SYNTHROID, LEVOTHROID) 25 MCG tablet Take 25 mcg by mouth daily before breakfast.   Yes Historical Provider, MD  lisinopril-hydrochlorothiazide (PRINZIDE,ZESTORETIC) 20-25 MG per tablet Take 1 tablet by mouth daily.  09/08/10  Yes Historical Provider, MD  Multiple Vitamins-Minerals (MULTIVITAMIN WITH MINERALS) tablet Take 1 tablet by mouth daily.   Yes Historical Provider, MD  omeprazole (PRILOSEC) 20 MG capsule Take 20 mg by mouth daily.  09/08/10  Yes Historical Provider, MD  pravastatin (PRAVACHOL) 20 MG tablet Take 20 mg by mouth 3 (three) times a week.  11/04/10  Yes Historical Provider, MD  saxagliptin HCl (ONGLYZA) 5 MG TABS tablet Take 5 mg by mouth daily.   Yes Historical Provider, MD  vitamin B-12 (CYANOCOBALAMIN) 1000 MCG tablet Take 1,000 mcg by mouth daily.   Yes Historical Provider, MD  nitroGLYCERIN (NITROSTAT) 0.4 MG SL tablet Place 0.4 mg under the tongue every 5 (five) minutes as needed for chest pain.  08/10/15   Historical Provider, MD     History reviewed. No pertinent family history.  Social History   Social History  . Marital Status: Married    Spouse Name: N/A  . Number of Children: N/A  . Years of Education:  N/A   Social History Main Topics  . Smoking status: Former Smoker    Types: Cigarettes  . Smokeless tobacco: Former Systems developer    Quit date: 11/01/1977  . Alcohol Use: None  . Drug Use: None  . Sexual Activity: Not Asked   Other Topics Concern  . None   Social History Narrative     Review of Systems: A 12 point ROS discussed and pertinent positives are indicated in the HPI above.  All other systems are negative.  Review of  Systems  Constitutional: Negative for fever, activity change, appetite change and fatigue.  HENT: Negative for drooling, tinnitus and trouble swallowing.   Eyes: Negative for visual disturbance.  Respiratory: Negative for cough.   Gastrointestinal: Negative for abdominal pain.  Neurological: Positive for syncope, weakness and light-headedness. Negative for dizziness, tremors, seizures, facial asymmetry, speech difficulty, numbness and headaches.  Psychiatric/Behavioral: Negative for behavioral problems and confusion.    Vital Signs: BP 175/73 mmHg  Pulse 70  Temp(Src) 98.6 F (37 C) (Oral)  Resp 16  Ht 5\' 8"  (1.727 m)  Wt 174 lb (78.926 kg)  BMI 26.46 kg/m2  SpO2 100%  Physical Exam  Constitutional: He is oriented to person, place, and time. He appears well-nourished.  Eyes: EOM are normal.  Neck: Normal range of motion.  Cardiovascular: Normal rate, regular rhythm and normal heart sounds.   Pulmonary/Chest: Effort normal and breath sounds normal. He has no wheezes.  Abdominal: Soft. Bowel sounds are normal. There is no tenderness.  Musculoskeletal: Normal range of motion.  Neurological: He is alert and oriented to person, place, and time.  Skin: Skin is warm and dry.  Psychiatric: He has a normal mood and affect. His behavior is normal. Judgment and thought content normal.  Nursing note and vitals reviewed.   Mallampati Score:  MD Evaluation Airway: WNL Heart: WNL Abdomen: WNL Chest/ Lungs: WNL ASA  Classification: 3 Mallampati/Airway Score: One  Imaging: Mr Brain Wo Contrast  08/29/2015  CLINICAL DATA:  Syncopal episode yesterday. EXAM: MRI HEAD WITHOUT CONTRAST TECHNIQUE: Multiplanar, multiecho pulse sequences of the brain and surrounding structures were obtained without intravenous contrast. COMPARISON:  CTA of the head and neck 08/28/2015. FINDINGS: The diffusion-weighted images demonstrate no evidence for acute or subacute infarction. Acute hemorrhage or mass  lesion is present. Remote lacunar infarcts are present within the basal ganglia and cerebellum bilaterally Moderate periventricular white matter changes are present bilaterally. Focal remote nonhemorrhagic cortical infarcts are present in the right parietal lobe. The internal auditory canals are within normal limits bilaterally. White matter changes extend into the brainstem. A remote lacunar infarct is present in the left thalamus. Moderate generalized atrophy is present. No significant extra-axial fluid collection is present. Abnormal signal in the right vertebral artery below the dural margin and within the cavernous right internal carotid artery compatible with occlusion is identified on the previous study. Flow is otherwise present in the major intracranial arteries. The skullbase is within normal limits. Midline sagittal images are unremarkable. IMPRESSION: 1. No acute intracranial abnormality. 2. Multiple remote lacunar infarcts of the basal ganglia and cerebellum bilaterally. 3. Remote small cortical infarcts of the right parietal lobe. 4. Abnormal signal within the cavernous right internal carotid artery and the right vertebral artery compatible with occlusions identified on the previous study. Flow is present within the right M1 segment. Electronically Signed   By: San Morelle M.D.   On: 08/29/2015 10:31    Labs:  CBC:  Recent Labs  08/29/15 0530 08/30/15  0430  WBC 7.4 7.6  HGB 12.0* 12.3*  HCT 35.2* 36.1*  PLT 173 163    COAGS:  Recent Labs  08/30/15 0731  INR 1.05  APTT 27    BMP:  Recent Labs  08/29/15 0530 08/30/15 0430  NA 132* 136  K 4.2 4.3  CL 99* 101  CO2 23 25  GLUCOSE 347* 178*  BUN 25* 23*  CALCIUM 8.9 9.2  CREATININE 1.49* 1.56*  GFRNONAA 43* 40*  GFRAA 49* 47*    LIVER FUNCTION TESTS:  Recent Labs  08/29/15 0530  BILITOT 0.7  AST 31  ALT 26  ALKPHOS 77  PROT 5.5*  ALBUMIN 3.0*    TUMOR MARKERS: No results for input(s): AFPTM,  CEA, CA199, CHROMGRNA in the last 8760 hours.  Assessment and Plan:  Recurrent syncope Abnormal CT and MR Brain Now scheduled for cerebral arteriogram Risks and Benefits discussed with the patient including, but not limited to bleeding, infection, vascular injury, contrast induced renal failure, stroke or even death. All of the patient's questions were answered, patient is agreeable to proceed. Consent signed and in chart.  Thank you for this interesting consult.  I greatly enjoyed meeting Johnny Murphy and look forward to participating in their care.  A copy of this report was sent to the requesting provider on this date.  Electronically Signed: Monia Sabal A 08/30/2015, 8:12 AM   I spent a total of 40 Minutes    in face to face in clinical consultation, greater than 50% of which was counseling/coordinating care for cerebral arteriogram

## 2015-08-30 NOTE — Progress Notes (Signed)
PT Cancellation Note  Patient Details Name: Johnny Murphy MRN: [id number] DOB: 04/24/1933   Cancelled Treatment:    Reason Eval/Treat Not Completed: Patient at procedure or test/unavailable. Pt off unit for arteriogram. Will have period of bedrest post-procedure. PT will see later as able   Dashawna Delbridge 08/30/2015, 9:02 AM  Pager [phone]

## 2015-08-30 NOTE — Progress Notes (Signed)
Physical Therapy Treatment Patient Details Name: Johnny Murphy MRN: [id number] DOB: [birth year] Today's Date: 08/30/2015    History of Present Illness Johnny Murphy is a 82 y.o. male with a past medical history of bil knee surgery, hypertension, carotid artery stenosis, diabetes, hyperlipidemia, hypothyroidism, who is being admitted to the neurology floor after presenting [facility] via EMS due to syncope. 4/10 arteriogram showed Rt vertebral and carotid artery occlusions with Lt vertebral artery 75% stenosed.    PT Comments    Patient denies falls related to imbalance, solely due to syncope. Patient with slightly antalgic gait due to bil knee arthritis and therefore this reduces his Dynamic Gait Index score. Patient denies need for walker or cane to potentially decr knee pain.   Follow Up Recommendations  Outpatient PT;Supervision - Intermittent (patient unsure he wants to attend OPPT; wants to begin walking program)     Equipment Recommendations  None recommended by PT    Recommendations for Other Services       Precautions / Restrictions Precautions Precautions: Fall Restrictions Weight Bearing Restrictions: No    Mobility  Bed Mobility Overal bed mobility: Needs Assistance Bed Mobility: Supine to Sit     Supine to sit: Min assist     General bed mobility comments: HOB flat, no rail; pt struggling to push up with LUE and required assist to raise torso  Transfers Overall transfer level: Needs assistance Equipment used: None Transfers: Sit to/from Stand Sit to Stand: Min guard         General transfer comment: pt with good technique, pt min guard due to syncopal history  Ambulation/Gait Ambulation/Gait assistance: Min guard Ambulation Distance (Feet): 300 Feet Assistive device: None Gait Pattern/deviations: Step-through pattern;Decreased stride length;Antalgic Gait velocity: decr, however with good ability to vary when instructed Gait velocity  interpretation: at or above normal speed for age/gender General Gait Details: slightly wide BOS and slightly antalgic with pt reporting bil knee pain   Stairs            Wheelchair Mobility    Modified Rankin (Stroke Patients Only) Modified Rankin (Stroke Patients Only) Pre-Morbid Rankin Score: No symptoms Modified Rankin: Moderately severe disability     Balance                                    Cognition Arousal/Alertness: Awake/alert Behavior During Therapy: WFL for tasks assessed/performed Overall Cognitive Status: Within Functional Limits for tasks assessed                      Exercises      General Comments        Pertinent Vitals/Pain Pain Assessment: 0-10 Pain Score: 3  Pain Location: bil knee arthritis Pain Descriptors / Indicators: Aching Pain Intervention(s): Limited activity within patient's tolerance    Home Living                      Prior Function            PT Goals (current goals can now be found in the care plan section) Acute Rehab PT Goals Patient Stated Goal: home Time For Goal Achievement: 09/05/15 Progress towards PT goals: Progressing toward goals    Frequency  Min 3X/week    PT Plan Current plan remains appropriate    Co-evaluation             End  of Session Equipment Utilized During Treatment: Gait belt Activity Tolerance: Patient tolerated treatment well Patient left: in chair;with call bell/phone within reach;with chair alarm set;with family/visitor present     Time: 1417-1436 PT Time Calculation (min) (ACUTE ONLY): 19 min  Charges:  $Gait Training: 8-22 mins                    G Codes:      [name] 09-25-2015, 2:49 PM Pager [phone]

## 2015-08-30 NOTE — Progress Notes (Signed)
[facility] PROGRESS NOTE  Johnny Murphy [id number] DOB: 11-Jan-1933 DOA: 08/28/2015  PCP: Gilford Rile, MD  Brief HPI: 82 year old Caucasian male with a past medical history of mild dementia, hypertension, carotid artery stenosis, diabetes, hypothyroidism, was eating breakfast at a restaurant when he suddenly passed out. EMS noted that he had left-sided weakness. The symptoms resolved after he came into the emergency department. Apparently, patient was unresponsive for almost 45 minutes per family.  Past medical history:  Past Medical History  Diagnosis Date  . Diabetes mellitus   . Hypertension   . Myocardial infarction ([place])   . Arthritis   . Carotid stenosis, asymptomatic 12/02/2010    Consultants: Neurology. Interventional radiology.  Procedures:  Transthoracic echocardiogram is pending  Cerebral angiogram 4/10  Antibiotics: None  Subjective: Patient seen after his angiogram. He denies any complaints. No further syncopal episodes. No headaches. No nausea, vomiting.   Objective: Vital Signs  Filed Vitals:   08/30/15 1000 08/30/15 1009 08/30/15 1018 08/30/15 1046  BP: 163/75 171/73 160/72 190/73  Pulse: 67 72 67 73  Temp:    97.7 F (36.5 C)  TempSrc:      Resp: 12 13 12 18   Height:      Weight:      SpO2: 99% 94% 99% 100%   No intake or output data in the 24 hours ending 08/30/15 1232 Filed Weights   08/28/15 1817  Weight: 78.926 kg (174 lb)    General appearance: alert, cooperative, appears stated age and no distress Resp: clear to auscultation bilaterally Cardio: regular rate and rhythm, S1, S2 normal, no murmur, click, rub or gallop. Bruit heard over the left carotid GI: soft, non-tender; bowel sounds normal; no masses,  no organomegaly Extremities: extremities normal, atraumatic, no cyanosis or edema. No swelling noted at the right groin. Dressing noted. Neurologic: Awake and alert. Oriented. Cranial nerves II-12 intact. Motor strength equal.  Normal bilateral upper and lower extremities.   Lab Results:  Basic Metabolic Panel:  Recent Labs Lab 08/28/15 2037 08/29/15 0530 08/30/15 0430  NA  --  132* 136  K  --  4.2 4.3  CL  --  99* 101  CO2  --  23 25  GLUCOSE  --  347* 178*  BUN  --  25* 23*  CREATININE  --  1.49* 1.56*  CALCIUM  --  8.9 9.2  MG 1.5*  --   --   PHOS 3.6  --   --    Liver Function Tests:  Recent Labs Lab 08/29/15 0530  AST 31  ALT 26  ALKPHOS 77  BILITOT 0.7  PROT 5.5*  ALBUMIN 3.0*   CBC:  Recent Labs Lab 08/29/15 0530 08/30/15 0430  WBC 7.4 7.6  NEUTROABS 4.4  --   HGB 12.0* 12.3*  HCT 35.2* 36.1*  MCV 91.9 92.1  PLT 173 163   Cardiac Enzymes:  Recent Labs Lab 08/28/15 2037 08/29/15 0530  TROPONINI 0.03 0.03   CBG:  Recent Labs Lab 08/29/15 0647 08/29/15 1135 08/29/15 2229 08/30/15 0630 08/30/15 1127  GLUCAP 312* 175* 294* 173* 168*      Studies/Results: Mr Brain Wo Contrast  08/29/2015  CLINICAL DATA:  Syncopal episode yesterday. EXAM: MRI HEAD WITHOUT CONTRAST TECHNIQUE: Multiplanar, multiecho pulse sequences of the brain and surrounding structures were obtained without intravenous contrast. COMPARISON:  CTA of the head and neck 08/28/2015. FINDINGS: The diffusion-weighted images demonstrate no evidence for acute or subacute infarction. Acute hemorrhage or mass lesion is present. Remote  lacunar infarcts are present within the basal ganglia and cerebellum bilaterally Moderate periventricular white matter changes are present bilaterally. Focal remote nonhemorrhagic cortical infarcts are present in the right parietal lobe. The internal auditory canals are within normal limits bilaterally. White matter changes extend into the brainstem. A remote lacunar infarct is present in the left thalamus. Moderate generalized atrophy is present. No significant extra-axial fluid collection is present. Abnormal signal in the right vertebral artery below the dural margin and within the  cavernous right internal carotid artery compatible with occlusion is identified on the previous study. Flow is otherwise present in the major intracranial arteries. The skullbase is within normal limits. Midline sagittal images are unremarkable. IMPRESSION: 1. No acute intracranial abnormality. 2. Multiple remote lacunar infarcts of the basal ganglia and cerebellum bilaterally. 3. Remote small cortical infarcts of the right parietal lobe. 4. Abnormal signal within the cavernous right internal carotid artery and the right vertebral artery compatible with occlusions identified on the previous study. Flow is present within the right M1 segment. Electronically Signed   By: San Morelle M.D.   On: 08/29/2015 10:31    Medications:  Scheduled: . aspirin  81 mg Oral Daily  . clopidogrel  75 mg Oral Daily  . enoxaparin (LOVENOX) injection  40 mg Subcutaneous Q24H  . fentaNYL      . heparin lock flush      . insulin aspart  0-15 Units Subcutaneous TID WC  . insulin glargine  5 Units Subcutaneous QHS  . insulin glargine  8 Units Subcutaneous Daily  . levothyroxine  25 mcg Oral QAC breakfast  . lidocaine      . midazolam      . pantoprazole  40 mg Oral Daily  . pravastatin  20 mg Oral QHS  . sodium chloride flush  3 mL Intravenous Q12H   Continuous: . sodium chloride 75 mL/hr at 08/30/15 0149  . sodium chloride     [id number] **OR** ondansetron (ZOFRAN) IV, senna-docusate  Assessment/Plan:  Principal Problem:   TIA (transient ischemic attack) Active Problems:   HTN (hypertension)   Carotid stenosis   DM type 2 (diabetes mellitus, type 2) (HCC)   Hyperlipidemia   Hypothyroidism    Syncope Patient underwent cerebral angiogram this morning. Evaluation has revealed bilateral vertebral artery stenosis. 70-75% on the left and completely occluded on the right. Occluded right internal carotid artery. This could be the reason for his syncope. Further management per neurology and  interventional radiology. Telemetry shows first degree AV block. Also noted was an episode of type I second-degree AV block. One Episode of pause lasting 1.8 s noted. Patient was on beta blocker at home. This has been held. Echocardiogram is pending. MRI brain does not show any stroke. Patient apparently underwent CT angiogram of his head and neck at the outside facility. Report is not available at this time. However, neurologist did find out that it did showed vertebral artery stenosis bilaterally, as well as carotid artery stenosis. Patient is currently on aspirin and Plavix.  History of carotid artery stenosis Carotid Doppler has been discontinued by neurology. CT angiogram report from outside facilities pending but apparently shows significant stenosis as discussed above. Event angiogram this morning.  Essential hypertension Blood pressure is noted to be elevated. Consider resuming his oral agents as he does not have a stroke but would still like higher blood pressures considering significant stenoses.   Type 2 diabetes CBGs reasonably well controlled now. Continue current regimen of Lantus. Continue sliding scale  coverage. HbA1c 11.6. .  Hypothyroidism Continue his home medications. TSH 2.51.  History of hyperlipidemia Continue his home medication  Mildly elevated BUN and creatinine Baseline renal function is unknown. He could have chronic kidney disease. Rate remains slightly higher today compared to yesterday. Give small IV fluid bolus. Continue IV fluids. He did receive contrast with the angiogram. Repeat labs tomorrow.   DVT Prophylaxis: Lovenox    Code Status: Full code  Family Communication: Discussed with the patient  Disposition Plan: Angiogram was done today. Echocardiogram is pending. PT and OT evaluation.    LOS: 2 days   [facility] Pager [phone] 08/30/2015, 12:32 PM  If 7PM-7AM, please contact night-coverage at [email or web address], password  [facility]

## 2015-08-30 NOTE — Progress Notes (Signed)
Subjective: No further syncope  Exam: Filed Vitals:   08/30/15 1046 08/30/15 1420  BP: 190/73 143/71  Pulse: 73 60  Temp: 97.7 F (36.5 C) 97.7 F (36.5 C)  Resp: 18 20   Gen: In bed, NAD Resp: non-labored breathing, no acute distress Abd: soft, nt  Neuro: MS: awake, alert.  [id number] Motor: 5/5 throughout Sensory:intact to LT  Pertinent Labs: Creatinine 1.5  Impression: 82 yo M with recurrent syncope. I discussed with the family the difficulty of being certain that the stenosis is responsible, but he has been on antiplatelet/statin therapy and despite this has had multiple cerebellar infarcts. Dr. Estanislado Pandy has seen the patient and is recommending a vertebral stent. I think that given the above info(multiple posterior circulation infarcts and syncope) this may be reasonable. He has not been on dual antiplatelets, but even this would not be expected to fix the syncope if VBI is responsible.   Recommendations: 1) Defer interventional treatment decisions to dr. Estanislado Pandy.  2) Consider increasing statin therapy for goal LDL < 70 3) continue dual antiplatelets with asa+plavix.  4) Will continue to follow.   Roland Rack, MD [facility] [phone]  If 7pm- 7am, please page neurology on call as listed in [place].

## 2015-08-30 NOTE — Progress Notes (Signed)
Patient transport to angiogram via bed; prior to transport patient out of bed this morning to the bathroom; assisted with bath and he brushed his teeth at the bedside; c/o fatique; denies pain.

## 2015-08-30 NOTE — Procedures (Signed)
S/P 4 vessel cerebral arteriogram. RT CFA approach.Marland Kitchen Marland KitchenFindings.Marland Kitchen 1.Approx. 70 to 75 % stenosis of dominant Lt VA origin. 2.Occluded RT VA. 3.Occluded RT ICA at the supraclinoid seg

## 2015-08-30 NOTE — Care Management Note (Signed)
Case Management Note  Patient Details  Name: Johnny Murphy MRN: [id number] Date of Birth: [birth year]  Subjective/Objective:                    Action/Plan: Patient presented with syncope. Transferred to [facility] from [facility] ED. Lives at home with spouse. Will follow for discharge needs pending patient's progress and physician orders.  Expected Discharge Date:                  Expected Discharge Plan:     In-House Referral:     Discharge planning Services     Post Acute Care Choice:    Choice offered to:     DME Arranged:    DME Agency:     HH Arranged:    HH Agency:     Status of Service:  In process, will continue to follow  Medicare Important Message Given:    Date Medicare IM Given:    Medicare IM give by:    Date Additional Medicare IM Given:    Additional Medicare Important Message give by:     If discussed at [place] of Stay Meetings, dates discussed:    Additional CommentsRolm Baptise, RN 08/30/2015, 10:59 AM [phone]

## 2015-08-31 ENCOUNTER — Observation Stay (HOSPITAL_BASED_OUTPATIENT_CLINIC_OR_DEPARTMENT_OTHER): Payer: Medicare Other

## 2015-08-31 ENCOUNTER — Observation Stay (HOSPITAL_COMMUNITY): Payer: Medicare Other

## 2015-08-31 DIAGNOSIS — R079 Chest pain, unspecified: Secondary | ICD-10-CM | POA: Diagnosis not present

## 2015-08-31 DIAGNOSIS — E118 Type 2 diabetes mellitus with unspecified complications: Secondary | ICD-10-CM | POA: Diagnosis not present

## 2015-08-31 DIAGNOSIS — I10 Essential (primary) hypertension: Secondary | ICD-10-CM | POA: Diagnosis not present

## 2015-08-31 DIAGNOSIS — E039 Hypothyroidism, unspecified: Secondary | ICD-10-CM | POA: Diagnosis not present

## 2015-08-31 DIAGNOSIS — G459 Transient cerebral ischemic attack, unspecified: Principal | ICD-10-CM

## 2015-08-31 DIAGNOSIS — Z794 Long term (current) use of insulin: Secondary | ICD-10-CM | POA: Diagnosis not present

## 2015-08-31 LAB — URINALYSIS, ROUTINE W REFLEX MICROSCOPIC
BILIRUBIN URINE: NEGATIVE
Glucose, UA: NEGATIVE mg/dL
HGB URINE DIPSTICK: NEGATIVE
KETONES UR: NEGATIVE mg/dL
Leukocytes, UA: NEGATIVE
Nitrite: NEGATIVE
PH: 7 (ref 5.0–8.0)
Protein, ur: 100 mg/dL — AB
SPECIFIC GRAVITY, URINE: 1.01 (ref 1.005–1.030)

## 2015-08-31 LAB — GLUCOSE, CAPILLARY
GLUCOSE-CAPILLARY: 270 mg/dL — AB (ref 65–99)
Glucose-Capillary: 194 mg/dL — ABNORMAL HIGH (ref 65–99)
Glucose-Capillary: 289 mg/dL — ABNORMAL HIGH (ref 65–99)

## 2015-08-31 LAB — BASIC METABOLIC PANEL
Anion gap: 11 (ref 5–15)
BUN: 22 mg/dL — AB (ref 6–20)
CALCIUM: 9.2 mg/dL (ref 8.9–10.3)
CHLORIDE: 100 mmol/L — AB (ref 101–111)
CO2: 25 mmol/L (ref 22–32)
Creatinine, Ser: 1.68 mg/dL — ABNORMAL HIGH (ref 0.61–1.24)
GFR calc Af Amer: 43 mL/min — ABNORMAL LOW (ref >60)
GFR calc non Af Amer: 37 mL/min — ABNORMAL LOW (ref >60)
Glucose, Bld: 298 mg/dL — ABNORMAL HIGH (ref 65–99)
Potassium: 4.5 mmol/L (ref 3.5–5.1)
SODIUM: 136 mmol/L (ref 135–145)

## 2015-08-31 LAB — URINE MICROSCOPIC-ADD ON

## 2015-08-31 MED ORDER — ALUM & MAG HYDROXIDE-SIMETH 200-200-20 MG/5ML PO SUSP
30.0000 mL | Freq: Four times a day (QID) | ORAL | Status: DC | PRN
  Administered 2015-08-31 – 2015-09-01 (×2): 30 mL via ORAL
  Filled 2015-08-31 (×2): qty 30

## 2015-08-31 MED ORDER — SODIUM CHLORIDE 0.9 % IV BOLUS (SEPSIS)
250.0000 mL | Freq: Once | INTRAVENOUS | Status: AC
  Administered 2015-08-31: 250 mL via INTRAVENOUS

## 2015-08-31 MED ORDER — SODIUM CHLORIDE 0.9 % IV SOLN
INTRAVENOUS | Status: DC
  Administered 2015-08-31: 12:00:00 via INTRAVENOUS

## 2015-08-31 NOTE — Progress Notes (Signed)
Subjective: The patient denies any further syncopal or presyncopal episodes. He believes he is to be scheduled for vertebral artery stenting although he is not sure when this will be. Outpatient physical therapy has been recommended.  Objective: Current vital signs: BP 165/71 mmHg  Pulse 77  Temp(Src) 98.1 F (36.7 C) (Oral)  Resp 20  Ht 5\' 8"  (1.727 m)  Wt 78.926 kg (174 lb)  BMI 26.46 kg/m2  SpO2 100% Vital signs in last 24 hours: Temp:  [97.5 F (36.4 C)-98.4 F (36.9 C)] 98.1 F (36.7 C) (04/11 1030) Pulse Rate:  [60-77] 77 (04/11 1030) Resp:  [18-20] 20 (04/11 1030) BP: (143-177)/(59-76) 165/71 mmHg (04/11 1030) SpO2:  [98 %-100 %] 100 % (04/11 1030)  Intake/Output from previous day: 04/10 0701 - 04/11 0700 In: 360 [P.O.:360] Out: -  Intake/Output this shift: Total I/O In: 360 [P.O.:360] Out: -  Nutritional status: Diet Carb Modified Fluid consistency:: Thin; Room service appropriate?: Yes   Neurologic Exam:  MENTAL STATUS: awake, alert, Language fluent Follows simple commands. Naming intact   CRANIAL NERVES: pupils equal and reactive to light, visual fields full to confrontation, extraocular muscles intact, no nystagmus, facial sensation and strength symmetric, uvula midline, shoulder shrug symmetric, tongue midline. MOTOR: normal bulk and tone, full strength in the BUE, BLE 5/5 throughout SENSORY: normal and symmetric to light touch  COORDINATION: finger-nose-finger normal  REFLEXES: deep tendon reflexes are somewhat diminished in all areas GAIT/STATION: Not attempted   Lab Results: Basic Metabolic Panel:  Recent Labs Lab 08/28/15 2037 08/29/15 0530 08/30/15 0430 08/31/15 0859  NA  --  132* 136 136  K  --  4.2 4.3 4.5  CL  --  99* 101 100*  CO2  --  23 25 25   GLUCOSE  --  347* 178* 298*  BUN  --  25* 23* 22*  CREATININE  --  1.49* 1.56* 1.68*  CALCIUM  --  8.9 9.2 9.2  MG 1.5*  --   --   --   PHOS 3.6  --   --   --     Liver Function  Tests:  Recent Labs Lab 08/29/15 0530  AST 31  ALT 26  ALKPHOS 77  BILITOT 0.7  PROT 5.5*  ALBUMIN 3.0*   No results for input(s): LIPASE, AMYLASE in the last 168 hours. No results for input(s): AMMONIA in the last 168 hours.  CBC:  Recent Labs Lab 08/29/15 0530 08/30/15 0430  WBC 7.4 7.6  NEUTROABS 4.4  --   HGB 12.0* 12.3*  HCT 35.2* 36.1*  MCV 91.9 92.1  PLT 173 163    Cardiac Enzymes:  Recent Labs Lab 08/28/15 2037 08/29/15 0530  TROPONINI 0.03 0.03    Lipid Panel:  Recent Labs Lab 08/29/15 0530  CHOL 180  TRIG 274*  HDL 30*  CHOLHDL 6.0  VLDL 55*  LDLCALC 95    CBG:  Recent Labs Lab 08/30/15 1127 08/30/15 1654 08/30/15 2145 08/31/15 0625 08/31/15 1111  GLUCAP 168* 267* 158* 194* 289*    Microbiology: No results found for this or any previous visit.  Coagulation Studies:  Recent Labs  08/30/15 0731  LABPROT 13.9  INR 1.05    Imaging:   Ir Angio Intra Extracran Sel Com Carotid Innominate Bilat Mod Sed 08/30/2015   Approximately 75% narrowing of the dominant left vertebral artery at its origin.  Occluded right internal carotid artery just distal to the origin of the right posterior communicating artery.  Occluded right vertebral artery  at the level of C1.  Severe stenosis of the left external carotid artery at its origin.  Approximately 30% stenosis of the left internal carotid artery at the bulb by the NASCET criteria without associated ulcerations. Approximately 50% stenosis of the distal basilar artery, and to a lesser degree of the mid basilar artery.  Mild-to-moderate intracranial arteriosclerosis involving primarily the posterior circulation as described.     MRI brain without contrast 08/29/2015 1. No acute intracranial abnormality. 2. Multiple remote lacunar infarcts of the basal ganglia and cerebellum bilaterally. 3. Remote small cortical infarcts of the right parietal lobe. 4. Abnormal signal within the cavernous  right internal carotid artery and the right vertebral artery compatible with occlusions identified on the previous study. Flow is present within the right M1 segment.       Medications:  Scheduled: . aspirin  81 mg Oral Daily  . clopidogrel  75 mg Oral Daily  . enoxaparin (LOVENOX) injection  40 mg Subcutaneous Q24H  . insulin aspart  0-15 Units Subcutaneous TID WC  . insulin glargine  5 Units Subcutaneous QHS  . insulin glargine  8 Units Subcutaneous Daily  . levothyroxine  25 mcg Oral QAC breakfast  . pantoprazole  40 mg Oral Daily  . pravastatin  20 mg Oral QHS  . sodium chloride flush  3 mL Intravenous Q12H    Assessment/Plan:  The patient appears to be doing well at this time. He is anxious to go home and work in his garden. I cautioned him to avoid the hot sun and to stay well hydrated. I placed a call to Dr. Arlean Hopping scheduler and left a message regarding the timing for intervention.  Neurology will sign off at this time. Please call if we can be of further service.   Mikey Bussing PA-C [facility] Pager [phone] 08/31/2015, 12:02 PM

## 2015-08-31 NOTE — Progress Notes (Signed)
Patient requested heart burn medication d/t recent heartburn after ingesting hotdog from family. Order received.    Ave Filter, RN

## 2015-08-31 NOTE — Progress Notes (Signed)
  Echocardiogram 2D Echocardiogram has been performed.  Jennette Dubin 08/31/2015, 10:03 AM

## 2015-08-31 NOTE — Progress Notes (Signed)
[facility] PROGRESS NOTE  Johnny Murphy [id number] DOB: 04/18/1933 DOA: 08/28/2015  PCP: Gilford Rile, MD  Brief HPI: 82 year old Caucasian male with a past medical history of mild dementia, hypertension, carotid artery stenosis, diabetes, hypothyroidism, was eating breakfast at a restaurant when he suddenly passed out. EMS noted that he had left-sided weakness. The symptoms resolved after he came into the emergency department. Apparently, patient was unresponsive for almost 45 minutes per family.  Past medical history:  Past Medical History  Diagnosis Date  . Diabetes mellitus   . Hypertension   . Myocardial infarction ([place])   . Arthritis   . Carotid stenosis, asymptomatic 12/02/2010    Consultants: Neurology. Interventional radiology.  Procedures:  Transthoracic echocardiogram is pending  Cerebral angiogram 4/10 "IMPRESSION: Approximately 75% narrowing of the dominant left vertebral artery at its origin. Occluded right internal carotid artery just distal to the origin of the right posterior communicating artery. Occluded right vertebral artery at the level of C1. Severe stenosis of the left external carotid artery at its origin. Approximately 30% stenosis of the left internal carotid artery at the bulb by the NASCET criteria without associated ulcerations. Approximately 50% stenosis of the distal basilar artery, and to a lesser degree of the mid basilar artery. Mild-to-moderate intracranial arteriosclerosis involving primarily the posterior circulation as described."  Antibiotics: None  Subjective: Patient feels well this morning. Denies any complaints. No weakness on any one side of the body. No headaches. No nausea or vomiting.   Objective: Vital Signs  Filed Vitals:   08/30/15 1835 08/30/15 2145 08/31/15 0122 08/31/15 0457  BP: 177/72 159/73 145/76 158/59  Pulse: 72 69 71 67  Temp: 98.4 F (36.9 C) 98.1 F (36.7 C) 97.5 F (36.4 C) 98.4 F (36.9 C)    TempSrc: Oral Oral Oral Oral  Resp: 20 20 18 18   Height:      Weight:      SpO2: 98% 99% 100% 99%    Intake/Output Summary (Last 24 hours) at 08/31/15 0951 Last data filed at 08/31/15 0846  Gross per 24 hour  Intake    720 ml  Output      0 ml  Net    720 ml   Filed Weights   08/28/15 1817  Weight: 78.926 kg (174 lb)    General appearance: alert, cooperative, appears stated age and no distress Resp: clear to auscultation bilaterally Cardio: regular rate and rhythm, S1, S2 normal, no murmur, click, rub or gallop. Bruit heard over the left carotid GI: soft, non-tender; bowel sounds normal; no masses,  no organomegaly Neurologic: Awake and alert. Cranial nerves II-12 intact. Motor strength equal. Normal bilateral upper and lower extremities.   Lab Results:  Basic Metabolic Panel:  Recent Labs Lab 08/28/15 2037 08/29/15 0530 08/30/15 0430 08/31/15 0859  NA  --  132* 136 136  K  --  4.2 4.3 4.5  CL  --  99* 101 100*  CO2  --  23 25 25   GLUCOSE  --  347* 178* 298*  BUN  --  25* 23* 22*  CREATININE  --  1.49* 1.56* 1.68*  CALCIUM  --  8.9 9.2 9.2  MG 1.5*  --   --   --   PHOS 3.6  --   --   --    Liver Function Tests:  Recent Labs Lab 08/29/15 0530  AST 31  ALT 26  ALKPHOS 77  BILITOT 0.7  PROT 5.5*  ALBUMIN 3.0*   CBC:  Recent Labs Lab 08/29/15 0530 08/30/15 0430  WBC 7.4 7.6  NEUTROABS 4.4  --   HGB 12.0* 12.3*  HCT 35.2* 36.1*  MCV 91.9 92.1  PLT 173 163   Cardiac Enzymes:  Recent Labs Lab 08/28/15 2037 08/29/15 0530  TROPONINI 0.03 0.03   CBG:  Recent Labs Lab 08/30/15 0630 08/30/15 1127 08/30/15 1654 08/30/15 2145 08/31/15 0625  GLUCAP 173* 168* 267* 158* 194*      Studies/Results: Ir Angio Intra Extracran Sel Com Carotid Innominate Bilat Mod Sed  08/30/2015  CLINICAL DATA:  Vertebrobasilar ischemia. Syncope. Previous history 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 carotid artery intracranially.  EXAM: BILATERAL COMMON CAROTID AND INNOMINATE ANGIOGRAPHY AND BILATERAL VERTEBRAL ARTERY ANGIOGRAMS PROCEDURE: Contrast: 10mL ISOVUE-300 IOPAMIDOL (ISOVUE-300) INJECTION 61%, 20mL ISOVUE-300 IOPAMIDOL (ISOVUE-300) INJECTION 61% Anesthesia/Sedation:  Conscious sedation. Medications: Versed 1 mg IV. Fentanyl 25 mcg IV. Heparin 1000 units IV. Following a full explanation of the procedure along with the potential associated complications, an informed witnessed consent was obtained. The right groin was prepped and draped in the usual sterile fashion. Thereafter using modified Seldinger technique, transfemoral access into the right common femoral artery was obtained without difficulty. Over a 0.035 inch guidewire, a 5 French Pinnacle sheath was inserted. Through this, and also over 0.035 inch guidewire, a 5 [place] JB 1 catheter was advanced to the aortic arch region and selectively positioned in the right common carotid artery, the right subclavian artery, the left common carotid artery and the left subclavian artery. There were no acute complications. The patient tolerated the procedure well. FINDINGS: The right common carotid arteriogram demonstrates the right external carotid artery and its major branches to be widely patent. The right internal carotid artery at the bulb and just distal to the bulb demonstrates a long segment irregular atherosclerotic plaque with a shallow ulcer proximally. There is associated approximately 50% stenosis by the NASCET criteria. There is moderate tortuosity of the mid cervical right ICA. More distally patency of the petrous and the cavernous segments is noted. There is complete occlusion of the right internal carotid artery just distal to the origin of the right posterior communicating artery. The delayed arterial phase demonstrates attempt at leptomeningeal collateralization from the P3 segments of the right posterior cerebral artery supplying the posterior parietal right MCA  distribution. The right subclavian arteriogram demonstrates complete occlusion of a hypoplastic right vertebral artery at the level of C1. The left common carotid arteriogram demonstrates the left external carotid artery to be severely narrowed by approximately 75%. Its branches however are seen to opacify adequately. Also demonstrated is a shelf-like plaque of the posterolateral aspect of the left internal carotid artery resulting in approximately 30% stenosis by NASCET criteria. There is a U shaped tortuosity of the mid cervical left ICA without evidence of kinking. More distally, the vessel is seen to opacify normally to the cranial skull base. Normal opacification is seen of the petrous, cavernous and the supraclinoid segments. Left posterior communicating artery is seen opacifying the left posterior cerebral artery distribution. The left middle cerebral artery and the left anterior cerebral artery are seen to opacify into the capillary and the venous phases. Prompt simultaneous opacification via the anterior communicating artery of the right anterior cerebral artery A1-A2 segments and distally, and the right middle cerebral artery distribution is noted. The is a moderate 50% narrowing of the left anterior cerebral artery A1 segment. The left subclavian arteriogram demonstrates approximately 75% percent stenosis of the dominant left vertebral artery at its  origin. This is associated with a small plaque just proximal to its origin. Mild-to-moderate irregularity of the subclavian artery proximally is related to atherosclerotic disease. There is mild tortuosity of the left vertebral artery proximally. More distally the vessel is seen to opacify to the cranial skull base. Opacification of the left posterior inferior cerebellar artery is noted. Mild narrowing of the left vertebrobasilar junction just proximal to the basilar artery is noted as well as the mid basilar artery. A tapered narrowing of the distal basilar  artery of about 50% is noted as well. The left posterior cerebral artery and the right posterior cerebral artery demonstrate focal areas of caliber irregularity suggestive of intracranial arteriosclerosis. Retrograde opacification of the hypoplastic right vertebrobasilar junction to the right posterior-inferior cerebellar artery is also noted. IMPRESSION: Approximately 75% narrowing of the dominant left vertebral artery at its origin. Occluded right internal carotid artery just distal to the origin of the right posterior communicating artery. Occluded right vertebral artery at the level of C1. Severe stenosis of the left external carotid artery at its origin. Approximately 30% stenosis of the left internal carotid artery at the bulb by the NASCET criteria without associated ulcerations. Approximately 50% stenosis of the distal basilar artery, and to a lesser degree of the mid basilar artery. Mild-to-moderate intracranial arteriosclerosis involving primarily the posterior circulation as described. Electronically Signed   By: Luanne Bras M.D.   On: 08/30/2015 10:27   Ir Angio Vertebral Sel Subclavian Innominate Bilat Mod Sed  08/30/2015  CLINICAL DATA:  Vertebrobasilar ischemia. Syncope. Previous history 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 carotid artery intracranially. EXAM: BILATERAL COMMON CAROTID AND INNOMINATE ANGIOGRAPHY AND BILATERAL VERTEBRAL ARTERY ANGIOGRAMS PROCEDURE: Contrast: 51mL ISOVUE-300 IOPAMIDOL (ISOVUE-300) INJECTION 61%, 86mL ISOVUE-300 IOPAMIDOL (ISOVUE-300) INJECTION 61% Anesthesia/Sedation:  Conscious sedation. Medications: Versed 1 mg IV. Fentanyl 25 mcg IV. Heparin 1000 units IV. Following a full explanation of the procedure along with the potential associated complications, an informed witnessed consent was obtained. The right groin was prepped and draped in the usual sterile fashion. Thereafter using modified Seldinger technique, transfemoral access  into the right common femoral artery was obtained without difficulty. Over a 0.035 inch guidewire, a 5 French Pinnacle sheath was inserted. Through this, and also over 0.035 inch guidewire, a 5 [place] JB 1 catheter was advanced to the aortic arch region and selectively positioned in the right common carotid artery, the right subclavian artery, the left common carotid artery and the left subclavian artery. There were no acute complications. The patient tolerated the procedure well. FINDINGS: The right common carotid arteriogram demonstrates the right external carotid artery and its major branches to be widely patent. The right internal carotid artery at the bulb and just distal to the bulb demonstrates a long segment irregular atherosclerotic plaque with a shallow ulcer proximally. There is associated approximately 50% stenosis by the NASCET criteria. There is moderate tortuosity of the mid cervical right ICA. More distally patency of the petrous and the cavernous segments is noted. There is complete occlusion of the right internal carotid artery just distal to the origin of the right posterior communicating artery. The delayed arterial phase demonstrates attempt at leptomeningeal collateralization from the P3 segments of the right posterior cerebral artery supplying the posterior parietal right MCA distribution. The right subclavian arteriogram demonstrates complete occlusion of a hypoplastic right vertebral artery at the level of C1. The left common carotid arteriogram demonstrates the left external carotid artery to be severely narrowed by approximately 75%. Its branches however  are seen to opacify adequately. Also demonstrated is a shelf-like plaque of the posterolateral aspect of the left internal carotid artery resulting in approximately 30% stenosis by NASCET criteria. There is a U shaped tortuosity of the mid cervical left ICA without evidence of kinking. More distally, the vessel is seen to opacify normally  to the cranial skull base. Normal opacification is seen of the petrous, cavernous and the supraclinoid segments. Left posterior communicating artery is seen opacifying the left posterior cerebral artery distribution. The left middle cerebral artery and the left anterior cerebral artery are seen to opacify into the capillary and the venous phases. Prompt simultaneous opacification via the anterior communicating artery of the right anterior cerebral artery A1-A2 segments and distally, and the right middle cerebral artery distribution is noted. The is a moderate 50% narrowing of the left anterior cerebral artery A1 segment. The left subclavian arteriogram demonstrates approximately 75% percent stenosis of the dominant left vertebral artery at its origin. This is associated with a small plaque just proximal to its origin. Mild-to-moderate irregularity of the subclavian artery proximally is related to atherosclerotic disease. There is mild tortuosity of the left vertebral artery proximally. More distally the vessel is seen to opacify to the cranial skull base. Opacification of the left posterior inferior cerebellar artery is noted. Mild narrowing of the left vertebrobasilar junction just proximal to the basilar artery is noted as well as the mid basilar artery. A tapered narrowing of the distal basilar artery of about 50% is noted as well. The left posterior cerebral artery and the right posterior cerebral artery demonstrate focal areas of caliber irregularity suggestive of intracranial arteriosclerosis. Retrograde opacification of the hypoplastic right vertebrobasilar junction to the right posterior-inferior cerebellar artery is also noted. IMPRESSION: Approximately 75% narrowing of the dominant left vertebral artery at its origin. Occluded right internal carotid artery just distal to the origin of the right posterior communicating artery. Occluded right vertebral artery at the level of C1. Severe stenosis of the left  external carotid artery at its origin. Approximately 30% stenosis of the left internal carotid artery at the bulb by the NASCET criteria without associated ulcerations. Approximately 50% stenosis of the distal basilar artery, and to a lesser degree of the mid basilar artery. Mild-to-moderate intracranial arteriosclerosis involving primarily the posterior circulation as described. Electronically Signed   By: Luanne Bras M.D.   On: 08/30/2015 10:27    Medications:  Scheduled: . aspirin  81 mg Oral Daily  . clopidogrel  75 mg Oral Daily  . enoxaparin (LOVENOX) injection  40 mg Subcutaneous Q24H  . insulin aspart  0-15 Units Subcutaneous TID WC  . insulin glargine  5 Units Subcutaneous QHS  . insulin glargine  8 Units Subcutaneous Daily  . levothyroxine  25 mcg Oral QAC breakfast  . pantoprazole  40 mg Oral Daily  . pravastatin  20 mg Oral QHS  . sodium chloride flush  3 mL Intravenous Q12H   Continuous:   [id number], ondansetron **OR** ondansetron (ZOFRAN) IV, senna-docusate  Assessment/Plan:  Principal Problem:   TIA (transient ischemic attack) Active Problems:   HTN (hypertension)   Carotid stenosis   DM type 2 (diabetes mellitus, type 2) ([place])   Hyperlipidemia   Hypothyroidism    Syncope Patient underwent cerebral angiogram 4/10. Evaluation has revealed bilateral vertebral artery stenosis. 70-75% on the left and completely occluded on the right. Occluded right internal carotid artery. This could be the reason for his syncope. Further management per neurology and interventional radiology. Family wants  to proceed with intervention to the vertebral artery. Telemetry shows first degree AV block. Also noted was an episode of type I second-degree AV block. One Episode of pause lasting 1.8 s noted. Patient was on beta blocker at home. This has been held. Echocardiogram is pending. MRI brain does not show any stroke. Patient apparently underwent CT angiogram of his head and neck  at the outside facility. Report is not available at this time. However, neurologist did find out that it did showed vertebral artery stenosis bilaterally, as well as carotid artery stenosis. Patient is currently on aspirin and Plavix.  History of carotid artery stenosis Carotid Doppler has been discontinued by neurology. CT angiogram report from outside facilities pending but apparently shows significant stenosis as discussed above. Cerebral angiogram was done 4/10.  Mildly elevated BUN and creatinine Baseline renal function is unknown. He could have chronic kidney disease. Creatinine is slightly higher today. Contrast that he received with angiogram could have contributed. We will increase the rate of his IV fluids. We will get a renal ultrasound. Repeat labs tomorrow.   Essential hypertension Blood pressure is noted to be elevated. Consider resuming his oral agents as he does not have a stroke but would still like higher blood pressures considering significant stenoses.   Type 2 diabetes CBGs reasonably well controlled now. Continue current regimen of Lantus. Continue sliding scale coverage. HbA1c 11.6. .  Hypothyroidism Continue his home medications. TSH 2.51.  History of hyperlipidemia Continue his home medication  DVT Prophylaxis: Lovenox    Code Status: Full code  Family Communication: Discussed with the patient  Disposition Plan: Echocardiogram is pending. PT and OT evaluation. Discussed with his family. They want to proceed with stent placement to the vertebral artery. They're waiting to hear from Dr. Estanislado Pandy.    LOS: 3 days   [facility] Pager [phone] 08/31/2015, 9:51 AM  If 7PM-7AM, please contact night-coverage at [email or web address], password [facility]

## 2015-08-31 NOTE — Care Management Obs Status (Signed)
[facility] NOTIFICATION   Patient Details  Name: Johnny Murphy MRN: [id number] Date of Birth: [birth year]   Medicare Observation Status Notification Given:  Yes (Mri results negative)    Pollie Friar, RN 08/31/2015, 4:34 PM

## 2015-09-01 ENCOUNTER — Encounter (HOSPITAL_COMMUNITY): Payer: Self-pay | Admitting: Internal Medicine

## 2015-09-01 DIAGNOSIS — E785 Hyperlipidemia, unspecified: Secondary | ICD-10-CM | POA: Diagnosis not present

## 2015-09-01 DIAGNOSIS — I429 Cardiomyopathy, unspecified: Secondary | ICD-10-CM | POA: Diagnosis present

## 2015-09-01 DIAGNOSIS — I6521 Occlusion and stenosis of right carotid artery: Secondary | ICD-10-CM | POA: Diagnosis not present

## 2015-09-01 DIAGNOSIS — I213 ST elevation (STEMI) myocardial infarction of unspecified site: Secondary | ICD-10-CM

## 2015-09-01 DIAGNOSIS — N179 Acute kidney failure, unspecified: Secondary | ICD-10-CM | POA: Diagnosis present

## 2015-09-01 DIAGNOSIS — R55 Syncope and collapse: Secondary | ICD-10-CM | POA: Diagnosis present

## 2015-09-01 DIAGNOSIS — I10 Essential (primary) hypertension: Secondary | ICD-10-CM | POA: Diagnosis not present

## 2015-09-01 DIAGNOSIS — G458 Other transient cerebral ischemic attacks and related syndromes: Secondary | ICD-10-CM

## 2015-09-01 DIAGNOSIS — E038 Other specified hypothyroidism: Secondary | ICD-10-CM | POA: Diagnosis not present

## 2015-09-01 DIAGNOSIS — N178 Other acute kidney failure: Secondary | ICD-10-CM | POA: Diagnosis not present

## 2015-09-01 DIAGNOSIS — G459 Transient cerebral ischemic attack, unspecified: Secondary | ICD-10-CM | POA: Diagnosis not present

## 2015-09-01 LAB — GLUCOSE, CAPILLARY
GLUCOSE-CAPILLARY: 257 mg/dL — AB (ref 65–99)
Glucose-Capillary: 331 mg/dL — ABNORMAL HIGH (ref 65–99)
Glucose-Capillary: 214 mg/dL — ABNORMAL HIGH (ref 65–99)
Glucose-Capillary: 271 mg/dL — ABNORMAL HIGH (ref 65–99)
Glucose-Capillary: 120 mg/dL — ABNORMAL HIGH (ref 65–99)

## 2015-09-01 LAB — BASIC METABOLIC PANEL
Anion gap: 11 (ref 5–15)
BUN: 21 mg/dL — ABNORMAL HIGH (ref 6–20)
CALCIUM: 8.4 mg/dL — AB (ref 8.9–10.3)
CO2: 22 mmol/L (ref 22–32)
CREATININE: 1.43 mg/dL — AB (ref 0.61–1.24)
Chloride: 104 mmol/L (ref 101–111)
GFR calc non Af Amer: 45 mL/min — ABNORMAL LOW (ref >60)
GFR, EST AFRICAN AMERICAN: 52 mL/min — AB (ref >60)
GLUCOSE: 242 mg/dL — AB (ref 65–99)
Potassium: 4.1 mmol/L (ref 3.5–5.1)
Sodium: 137 mmol/L (ref 135–145)

## 2015-09-01 LAB — ECHOCARDIOGRAM COMPLETE
HEIGHTINCHES: 68 in
Weight: 2784 oz

## 2015-09-01 MED ORDER — CARVEDILOL 6.25 MG PO TABS
6.2500 mg | ORAL_TABLET | Freq: Two times a day (BID) | ORAL | Status: DC
  Administered 2015-09-01 – 2015-09-02 (×2): 6.25 mg via ORAL
  Filled 2015-09-01 (×2): qty 1

## 2015-09-01 MED ORDER — HEPARIN (PORCINE) IN NACL 100-0.45 UNIT/ML-% IJ SOLN
1350.0000 [IU]/h | INTRAMUSCULAR | Status: DC
  Administered 2015-09-01: 1200 [IU]/h via INTRAVENOUS
  Filled 2015-09-01: qty 250

## 2015-09-01 MED ORDER — MENTHOL 3 MG MT LOZG
1.0000 | LOZENGE | OROMUCOSAL | Status: DC | PRN
  Filled 2015-09-01: qty 9

## 2015-09-01 NOTE — Progress Notes (Signed)
[facility] PROGRESS NOTE  Johnny Murphy [id number] DOB: 08/19/1932 DOA: 08/28/2015  PCP: Gilford Rile, MD  Brief HPI: 82 year old Caucasian male with a past medical history of mild dementia, hypertension, carotid artery stenosis, diabetes, hypothyroidism, was eating breakfast at a restaurant when he suddenly passed out. EMS noted that he had left-sided weakness. The symptoms resolved after he came into the emergency department. Apparently, patient was unresponsive for almost 45 minutes per family.  Past medical history:  Past Medical History  Diagnosis Date  . Diabetes mellitus   . Hypertension   . Myocardial infarction ([place])   . Arthritis   . Carotid stenosis, asymptomatic 12/02/2010   Consultants:  Neurology. Interventional radiology. Cardiology  Procedures:  Transthoracic echocardiogram  Cerebral angiogram 4/10 "IMPRESSION: Approximately 75% narrowing of the dominant left vertebral artery at its origin. Occluded right internal carotid artery just distal to the origin of the right posterior communicating artery. Occluded right vertebral artery at the level of C1. Severe stenosis of the left external carotid artery at its origin. Approximately 30% stenosis of the left internal carotid artery at the bulb by the NASCET criteria without associated ulcerations. Approximately 50% stenosis of the distal basilar artery, and to a lesser degree of the mid basilar artery. Mild-to-moderate intracranial arteriosclerosis involving primarily the posterior circulation as described."  Antibiotics: None  Subjective: Patient feels well this morning. Denies any complaints.   Objective: Vital Signs  Filed Vitals:   09/01/15 0607 09/01/15 0943 09/01/15 1406 09/01/15 1648  BP: 154/69 130/40 150/61 155/66  Pulse: 76 76 88 79  Temp: 98.8 F (37.1 C) 98.6 F (37 C) 98.7 F (37.1 C) 98.5 F (36.9 C)  TempSrc: Oral Oral Oral Oral  Resp: 18 16 16 20   Height:      Weight:      SpO2:  98% 99% 100% 98%    Intake/Output Summary (Last 24 hours) at 09/01/15 1907 Last data filed at 09/01/15 1212  Gross per 24 hour  Intake    240 ml  Output      0 ml  Net    240 ml   Filed Weights   08/28/15 1817  Weight: 78.926 kg (174 lb)   General appearance: alert, cooperative, appears stated age and no distress Resp: clear to auscultation bilaterally Cardio: regular rate and rhythm, S1, S2 normal, no murmur, click, rub or gallop. Bruit heard over the left carotid GI: soft, non-tender; bowel sounds normal; no masses,  no organomegaly Neurologic: Awake and alert. Cranial nerves II-12 intact. Motor strength equal.   Lab Results: Basic Metabolic Panel:  Recent Labs Lab 08/28/15 2037 08/29/15 0530 08/30/15 0430 08/31/15 0859 09/01/15 0522  NA  --  132* 136 136 137  K  --  4.2 4.3 4.5 4.1  CL  --  99* 101 100* 104  CO2  --  23 25 25 22   GLUCOSE  --  347* 178* 298* 242*  BUN  --  25* 23* 22* 21*  CREATININE  --  1.49* 1.56* 1.68* 1.43*  CALCIUM  --  8.9 9.2 9.2 8.4*  MG 1.5*  --   --   --   --   PHOS 3.6  --   --   --   --    Liver Function Tests:  Recent Labs Lab 08/29/15 0530  AST 31  ALT 26  ALKPHOS 77  BILITOT 0.7  PROT 5.5*  ALBUMIN 3.0*   CBC:  Recent Labs Lab 08/29/15 0530 08/30/15 0430  WBC  7.4 7.6  NEUTROABS 4.4  --   HGB 12.0* 12.3*  HCT 35.2* 36.1*  MCV 91.9 92.1  PLT 173 163   Cardiac Enzymes:  Recent Labs Lab 08/28/15 2037 08/29/15 0530  TROPONINI 0.03 0.03   CBG:  Recent Labs Lab 08/31/15 1708 08/31/15 2213 09/01/15 0636 09/01/15 1138 09/01/15 1641  GLUCAP 270* 331* 214* 257* 271*   Studies/Results: US Renal  08/31/2015   No evidence of hydronephrosis. 2. Mild left renal cortical thinning may reflect chronic renal disease or underlying mild atrophy.   MRI brain 08/29/2015 1. No acute intracranial abnormality. 2. Multiple remote lacunar infarcts of the basal ganglia and cerebellum bilaterally. 3. Remote small cortical  infarcts of the right parietal lobe. 4. Abnormal signal within the cavernous right internal carotid artery and the right vertebral artery compatible with occlusions identified on the previous study.   Medications:  Scheduled: . aspirin  81 mg Oral Daily  . carvedilol  6.25 mg Oral BID WC  . clopidogrel  75 mg Oral Daily  . enoxaparin (LOVENOX) injection  40 mg Subcutaneous Q24H  . insulin aspart  0-15 Units Subcutaneous TID WC  . insulin glargine  5 Units Subcutaneous QHS  . insulin glargine  8 Units Subcutaneous Daily  . levothyroxine  25 mcg Oral QAC breakfast  . pantoprazole  40 mg Oral Daily  . pravastatin  20 mg Oral QHS  . sodium chloride flush  3 mL Intravenous Q12H   Continuous: . sodium chloride 100 mL/hr at 08/31/15 1144   [id number] & mag hydroxide-simeth, hydrALAZINE, menthol-cetylpyridinium, ondansetron **OR** ondansetron (ZOFRAN) IV, senna-docusate  Assessment/Plan:  Principal Problem:   TIA (transient ischemic attack) Active Problems:   HTN (hypertension)   Carotid stenosis   DM type 2 (diabetes mellitus, type 2) (HCC)   Hyperlipidemia   Hypothyroidism  Syncope - underwent cerebral angiogram 4/10. Evaluation has revealed bilateral vertebral artery stenosis. 70-75% on the left and completely occluded on the right.  - Occluded right internal carotid artery - MRI of the brain 08/29/2015 with multiple remote lacunar infarcts of the basal ganglia and cerebellum bilaterally, remote small cortical infarcts of the right parietal lobe - TTE with EF 25 %, ? For cardiology if this pt would benefit from loop recorder   Chronic CHF - EF 25 % per ECHO - cardiology consulted   History of carotid artery stenosis - CT angiogram report from outside facilities apparently shows significant stenosis  - Cerebral angiogram done 4/10.  Chronic kidney disease, stage III - ? Of acute vs chronic kidney disease  - unknown baseline but please note that pt meets criteria for CKD with  underlying HTN, uncontrolled DM with A1C > 11, chronic systolic CHF, renal US wit thinning or renal cortices which is c/w chronic medical renal disease - I suspect based on the above mentioned factors, pt falls into category of CKD - pt did however receive contrast but this alone would not cause thinning of the renal cortices seen on renal US   Essential hypertension - given systolic CHF, will place on coreg 6.25 mg PO BID - pt was apparently on beta blocker at home but there was ? Type I block - would also prefer to place on Lisinopril but will d/w cardiology first given elevated Cr  - keep on telemetry and follow up on cardiology team recommendations   Type 2 diabetes with complications of CKD stage III and long term use insulin  - Continue current regimen of Lantus.  - Continue  sliding scale coverage. HbA1c 11.6.  Hypothyroidism - Continue his home medications. TSH 2.51.  History of hyperlipidemia - Continue his home medication  DVT Prophylaxis: Lovenox SQ  Code Status: Full code  Family Communication: Discussed with the patient  Disposition Plan: Plan d/c home once cardiology team clears    LOS: 4 days   Faye Ramsay  [facility] Pager [phone] 09/01/2015, 7:07 PM  If 7PM-7AM, please contact night-coverage at [email or web address], password [facility]

## 2015-09-01 NOTE — Progress Notes (Signed)
Physical Therapy Treatment and Discharge Patient Details Name: Johnny Murphy MRN: [id number] DOB: 1932-08-27 Today's Date: 09/01/2015    History of Present Illness Johnny Murphy is a 82 y.o. male with a past medical history of bil knee surgery, hypertension, carotid artery stenosis, diabetes, hyperlipidemia, hypothyroidism, who is being admitted to the neurology floor after presenting to [facility] via EMS due to syncope. 4/10 arteriogram showed Rt vertebral and carotid artery occlusions with Lt vertebral artery 75% stenosed.    PT Comments    Patient reports he is at baseline. Walks with slightly antalgic gait due to OA pain. Scored 50/56 on Berg Balance Assessment. No further PT needs. 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   Follow Up Recommendations  No PT follow up     Equipment Recommendations  None recommended by PT    Recommendations for Other Services       Precautions / Restrictions Precautions Precautions: Fall Restrictions Weight Bearing Restrictions: No    Mobility  Bed Mobility Overal bed mobility: Modified Independent Bed Mobility: Supine to Sit     Supine to sit: Modified independent (Device/Increase time)     General bed mobility comments: HOB flat, no rail; slight incr effort  Transfers Overall transfer level: Independent Equipment used: None Transfers: Sit to/from Stand Sit to Stand: Independent         General transfer comment: pt with good technique, no use of hands  Ambulation/Gait Ambulation/Gait assistance: Modified independent (Device/Increase time) Ambulation Distance (Feet): 180 Feet Assistive device: None   Gait velocity: decr, however with good ability to vary when instructed   General Gait Details: slightly wide BOS and slightly antalgic with pt reporting bil knee pain   Stairs            Wheelchair Mobility    Modified Rankin (Stroke Patients Only) Modified Rankin (Stroke Patients  Only) Pre-Morbid Rankin Score: No symptoms Modified Rankin: Slight disability     Balance                                    Cognition Arousal/Alertness: Awake/alert Behavior During Therapy: WFL for tasks assessed/performed Overall Cognitive Status: Within Functional Limits for tasks assessed                      Exercises      General Comments        Pertinent Vitals/Pain Pain Assessment: No/denies pain    Home Living                      Prior Function            PT Goals (current goals can now be found in the care plan section) Acute Rehab PT Goals Patient Stated Goal: home Time For Goal Achievement: 09/05/15 Progress towards PT goals: Goals met/education completed, patient discharged from PT    Frequency  Min 3X/week    PT Plan Discharge plan needs to be updated    Co-evaluation             End of Session Equipment Utilized During Treatment: Gait belt Activity Tolerance: Patient tolerated treatment well Patient left: in chair;with call bell/phone within reach;with chair alarm set     Time: 1696-7893 PT Time Calculation (min) (ACUTE ONLY): 20 min  Charges:  $Therapeutic Activity: 8-22 mins  G Codes:  Functional Assessment Tool Used: clinical judgement Functional Limitation: Mobility: Walking and moving around Mobility: Walking and Moving Around Goal Status [phone]): At least 1 percent but less than 20 percent impaired, limited or restricted Mobility: Walking and Moving Around Discharge Status [phone]): At least 1 percent but less than 20 percent impaired, limited or restricted   Alina Gilkey 09/01/2015, 11:03 AM Pager [phone]

## 2015-09-01 NOTE — Consult Note (Signed)
CARDIOLOGY CONSULT NOTE   Patient ID: Johnny Murphy MRN: [id number] DOB/AGE: 80-Dec-1934 80 y.o.  Admit date: 08/28/2015  Primary Physician   Gilford Rile, MD Primary Cardiologist   Dr. Bettina Gavia Reason for Consultation  Low EF Requesting Physician  Dr. Doyle Askew  HPI: Johnny Murphy is a 82 y.o. 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   Patient had a sudden episode of syncope while eating at a restaurant with friends 08/28/15.  EMS was called and noted left-sided weakness and taken to [facility] where his CT scan was negative for acute event. His symptoms were resolved upon arrival to ED. He was hyperglycemic and mild anemic. MRI brain showed no acute stroke.The patient apparently underwent CT angiogram of his head and neck severe left vertebral artery stenosis, right vertebral artery is severely hypoplastic and ends at C2 level, and right ICA occlusion. 70-75% on the left and completely occluded on the right.  Per note, the patient had recurrent syncopal events. The patient transferred to [facility] for cerebral angiogram which showed    Approximately 75% narrowing of the dominant left vertebral artery at its origin.   Occluded right internal carotid artery just distal to the origin of the right posterior communicating artery.   Occluded right vertebral artery at the level of C1.   Severe stenosis of the left external carotid artery at its origin.   Approximately 30% stenosis of the left internal carotid artery at the bulb by the NASCET criteria without associated ulcerations.   Approximately 50% stenosis of the distal basilar artery, and to a lesser degree of the mid basilar  artery.   Mild-to-moderate intracranial arteriosclerosis involving primarily the posterior circulation as described  Felt multiple posterior circulation previous infarcts, also recurrent syncope. Plan is to f/u with Dr. Estanislado Pandy as outpatient for possible posterior circulation stent. Continue DAPT.   EKG from [facility] 08/28/15 shows normal sinus rhythm at a rate of 63 bpm, first-degree AV block, nonspecific ST/T-wave abnormality in inferior lateral lead. No prior EKG to compare. Per note, Telemetry shows first degree AV block. Also noted was an episode of type I second-degree AV block. One Episode of pause lasting 1.8 s noted. Patient was on beta blocker at home. This has been held. Currently patient is off telemetry and unable to review strip. Echocardiogram 08/31/15 shows LVEF is 25-30%, mildly dilated LV with normal wall thickness and severe global hypokinesis with regional variation, mild MR, moderate LAE, mild RVE with reduced RV systolic function, mild TR, RVSP 30 mmHg, dilated IVC with estimated RA pressure of 8 mmHg.  The patient states that prior to syncope episode he was doing yard work, moving lawm without any chest discomfort or shortness of breath. The patient denies lower extremity edema, orthopnea, palpitation, syncope, abdominal pain, blood in his stool or urine.  No cardiac cath report not available on care everywhere. Per note by Dr. Bettina Gavia 08/10/15 "In view of his complex CAD and high risk of ischemia we discussed the option of ischemia evaluation and he prefers to be treated medically at this time".    Past Medical History  Diagnosis Date  . Diabetes mellitus   . Hypertension   . Myocardial infarction ([place])   .  Arthritis   . Carotid stenosis, asymptomatic 12/02/2010     Past Surgical History  Procedure Laterality Date  . Trapease ivc filter  05/01/2002    conditional 5 up to 3T Mri     Allergies  Allergen Reactions  . Statins Other (See Comments)    Joint  pain    I have reviewed the patient's current medications . aspirin  81 mg Oral Daily  . carvedilol  6.25 mg Oral BID WC  . clopidogrel  75 mg Oral Daily  . enoxaparin (LOVENOX) injection  40 mg Subcutaneous Q24H  . insulin aspart  0-15 Units Subcutaneous TID WC  . insulin glargine  5 Units Subcutaneous QHS  . insulin glargine  8 Units Subcutaneous Daily  . levothyroxine  25 mcg Oral QAC breakfast  . pantoprazole  40 mg Oral Daily  . pravastatin  20 mg Oral QHS  . sodium chloride flush  3 mL Intravenous Q12H   . sodium chloride 100 mL/hr at 08/31/15 1144   alum & mag hydroxide-simeth, hydrALAZINE, menthol-cetylpyridinium, ondansetron **OR** ondansetron (ZOFRAN) IV, senna-docusate  Prior to Admission medications   Medication Sig Start Date End Date Taking? Authorizing Provider  amLODipine (NORVASC) 5 MG tablet Take 5 mg by mouth daily.  09/19/10  Yes Historical Provider, MD  aspirin 81 MG tablet Take 81 mg by mouth daily.   Yes Historical Provider, MD  carvedilol (COREG) 25 MG tablet Take 12.5 mg by mouth 2 (two) times daily with a meal.  11/04/10  Yes Historical Provider, MD  cholecalciferol (VITAMIN D) 1000 units tablet Take 1,000 Units by mouth daily.   Yes Historical Provider, MD  colchicine 0.6 MG tablet Take 1 tablet by mouth daily as needed. 07/07/15  Yes Historical Provider, MD  GLIPIZIDE XL 10 MG 24 hr tablet Take 10 mg by mouth daily with breakfast.  09/08/10  Yes Historical Provider, MD  insulin glargine (LANTUS) 100 UNIT/ML injection Inject 10 Units into the skin at bedtime.   Yes Historical Provider, MD  levothyroxine (SYNTHROID, LEVOTHROID) 25 MCG tablet Take 25 mcg by mouth daily before breakfast.   Yes Historical Provider, MD  lisinopril-hydrochlorothiazide (PRINZIDE,ZESTORETIC) 20-25 MG per tablet Take 1 tablet by mouth daily.  09/08/10  Yes Historical Provider, MD  Multiple Vitamins-Minerals (MULTIVITAMIN WITH MINERALS) tablet Take 1 tablet by mouth daily.   Yes Historical  Provider, MD  omeprazole (PRILOSEC) 20 MG capsule Take 20 mg by mouth daily.  09/08/10  Yes Historical Provider, MD  pravastatin (PRAVACHOL) 20 MG tablet Take 20 mg by mouth 3 (three) times a week.  11/04/10  Yes Historical Provider, MD  saxagliptin HCl (ONGLYZA) 5 MG TABS tablet Take 5 mg by mouth daily.   Yes Historical Provider, MD  vitamin B-12 (CYANOCOBALAMIN) 1000 MCG tablet Take 1,000 mcg by mouth daily.   Yes Historical Provider, MD  nitroGLYCERIN (NITROSTAT) 0.4 MG SL tablet Place 0.4 mg under the tongue every 5 (five) minutes as needed for chest pain.  08/10/15   Historical Provider, MD     Social History   Social History  . Marital Status: Married    Spouse Name: N/A  . Number of Children: N/A  . Years of Education: N/A   Occupational History  . Not on file.   Social History Main Topics  . Smoking status: Former Smoker    Types: Cigarettes  . Smokeless tobacco: Former Systems developer    Quit date: 11/01/1977  . Alcohol Use: Not on file  . Drug Use: Not on  file  . Sexual Activity: Not on file   Other Topics Concern  . Not on file   Social History Narrative    Family history Father had a MI at age 84. Elderbrother had a MI at age 90  ROS:  Full 14 point review of systems complete and found to be negative unless listed above.  Physical Exam: Blood pressure 155/66, pulse 79, temperature 98.5 F (36.9 C), temperature source Oral, resp. rate 20, height 5\' 8"  (1.727 m), weight 174 lb (78.926 kg), SpO2 98 %.  General: Well developed, well nourished, male in no acute distress Head: Eyes PERRLA, No xanthomas. Normocephalic and atraumatic, oropharynx without edema or exudate.  Lungs: Resp regular and unlabored, CTA. Heart: RRR no s3, s4, or murmurs.   Neck: Bilateral carotid bruits. No lymphadenopathy.  No JVD. Abdomen: Bowel sounds present, abdomen soft and non-tender without masses or hernias noted. Msk:  No spine or cva tenderness. No weakness, no joint deformities or  effusions. Extremities: No clubbing, cyanosis or edema. DP/PT/Radials 2+ and equal bilaterally. Neuro: Alert and oriented X 3. No focal deficits noted. Psych:  Good affect, responds appropriately Skin: No rashes or lesions noted.  Labs:   Lab Results  Component Value Date   WBC 7.6 08/30/2015   HGB 12.3* 08/30/2015   HCT 36.1* 08/30/2015   MCV 92.1 08/30/2015   PLT 163 08/30/2015    Recent Labs  08/30/15 0731  INR 1.05    Recent Labs Lab 08/29/15 0530  09/01/15 0522  NA 132*  < > 137  K 4.2  < > 4.1  CL 99*  < > 104  CO2 23  < > 22  BUN 25*  < > 21*  CREATININE 1.49*  < > 1.43*  CALCIUM 8.9  < > 8.4*  PROT 5.5*  --   --   BILITOT 0.7  --   --   ALKPHOS 77  --   --   ALT 26  --   --   AST 31  --   --   GLUCOSE 347*  < > 242*  ALBUMIN 3.0*  --   --   < > = values in this interval not displayed. MAGNESIUM  Date Value Ref Range Status  08/28/2015 1.5* 1.7 - 2.4 mg/dL Final    Lab Results  Component Value Date   CHOL 180 08/29/2015   HDL 30* 08/29/2015   Captiva 95 08/29/2015   TRIG 274* 08/29/2015   No results found for: DDIMER No results found for: LIPASE, AMYLASE TSH  Date/Time Value Ref Range Status  08/28/2015 08:37 PM 2.510 0.350 - 4.500 uIU/mL Final    Echo: 08/31/15 LV EF: 25% - 30%  ------------------------------------------------------------------- Indications: Chest pain 786.51.  ------------------------------------------------------------------- History: PMH: Myocardial infarction. Transient ischemic attack. Risk factors: Former tobacco use. Hypertension. Diabetes mellitus. Dyslipidemia.  ------------------------------------------------------------------- Study Conclusions  - Left ventricle: The cavity size was mildly dilated. Wall  thickness was normal. Systolic function was severely reduced. The  estimated ejection fraction was in the range of 25% to 30%.  Global hypokinesis with regional variation. The study is  not  technically sufficient to allow evaluation of LV diastolic  function. - Mitral valve: Calcified annulus. Mildly thickened leaflets .  There was mild regurgitation. - Left atrium: Moderately dilated at 42 ml/m2. - Right ventricle: The cavity size was mildly dilated. Systolic  function is reduced. - Tricuspid valve: There was mild regurgitation. - Pulmonary arteries: PA peak pressure: 30 mm Hg (S). -  Systemic veins: The IVC measures <2.1 cm, but does not collapse  >50%, suggesting an elevated RA pressure of 8 mmHg.  Impressions:  - LVEF is 25-30%, mildly dilated LV with normal wall thickness and  severe global hypokinesis with regional variation, mild MR,  moderate LAE, mild RVE with reduced RV systolic function, mild  TR, RVSP 30 mmHg, dilated IVC with estimated RA pressure of 8  mmHg.  ECG: EKG from [facility] 08/28/15 shows normal sinus rhythm at a rate of 63 bpm, first-degree AV block, nonspecific ST/T-wave abnormality in inferior lateral lead. No prior EKG to compare.  Radiology:  US Renal  08/31/2015  CLINICAL DATA:  Acute onset of renal failure.  Initial encounter. EXAM: RENAL / URINARY TRACT ULTRASOUND COMPLETE COMPARISON:  None. FINDINGS: Right Kidney: Length: 12.0 cm. Echogenicity within normal limits. No mass or hydronephrosis visualized. Left Kidney: Length: 9.1 cm. Echogenicity within normal limits. Mild cortical thinning is noted. No mass or hydronephrosis visualized. Bladder: Appears normal for degree of bladder distention. Scattered calcified granulomata are noted within the spleen. IMPRESSION: 1. No evidence of hydronephrosis. 2. Mild left renal cortical thinning may reflect chronic renal disease or underlying mild atrophy. Electronically Signed   By: Garald Balding M.D.   On: 08/31/2015 21:26    ASSESSMENT AND PLAN:     1. Cardiomyopathy - CAD s/p CABG s/p PCI of LMCA and LCF in 2008. No record is available. No family at bed side currently.  -  chocardiogram 08/31/15 shows LVEF is 25-30%, mildly dilated LV with normal wall thickness and severe global hypokinesis with regional variation, mild MR, moderate LAE, mild RVE with reduced RV systolic function, mild TR, RVSP 30 mmHg, dilated IVC with estimated RA pressure of 8 mmHg. Questionable left apical thrombus.  - No anginal pain prior to syncopal episode, he was moving loan and doing yard work without any chest pain. -  EKG from [facility] 08/28/15 shows normal sinus rhythm at a rate of 63 bpm, first-degree AV block, nonspecific ST/T-wave abnormality in inferior lateral lead. No prior EKG to compare. - Seems like patient has declined ischemic evaluation in the past per Dr. Bettina Gavia  08/10/15. - Will review plan with MD.   2 AV block - Per progress note  08/31/15 - telemetry shows first degree AV block. Also noted was an episode of type I second-degree AV block. One Episode of pause lasting 1.8 s noted. Patient was on beta blocker at home. This has been held. Currently patient is off telemetry and unable to review strip.  - Coreg 6.25mg  resumed today.   3. HL -08/29/2015: Cholesterol 180; HDL 30*; LDL Cholesterol 95; Triglycerides 274*; VLDL 55*  - Continue statin 20mg   4. Uncontrolled DM - A1c of 11.6 this admission. Management per primary.   5. 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 Maryjean Morn.   6. Carotid artery disease - Seems previously seen by Dr. Scot Dock. ? Now following Dr. Maryjean Morn. Will differ further management per primary.   SignedLeanor Kail, [place] 09/01/2015, 6:54 PM Pager [phone]  Co-Sign MD

## 2015-09-01 NOTE — Progress Notes (Signed)
ANTICOAGULATION CONSULT NOTE - Initial Consult  Pharmacy Consult for heparin Indication: possible LV thrombus  Allergies  Allergen Reactions  . Statins Other (See Comments)    Joint pain    Patient Measurements: Height: 5\' 8"  (172.7 cm) Weight: 174 lb (78.926 kg) IBW/kg (Calculated) : 68.4 Heparin Dosing Weight: 78.9 kg  Vital Signs: Temp: 98.5 F (36.9 C) (04/12 1648) Temp Source: Oral (04/12 1648) BP: 155/66 mmHg (04/12 1648) Pulse Rate: 79 (04/12 1648)  Labs:  Recent Labs  08/30/15 0430 08/30/15 0731 08/31/15 0859 09/01/15 0522  HGB 12.3*  --   --   --   HCT 36.1*  --   --   --   PLT 163  --   --   --   APTT  --  27  --   --   LABPROT  --  13.9  --   --   INR  --  1.05  --   --   CREATININE 1.56*  --  1.68* 1.43*    Estimated Creatinine Clearance: 39.9 mL/min (by C-G formula based on Cr of 1.43).   Medical History: Past Medical History  Diagnosis Date  . Diabetes mellitus   . Hypertension   . Myocardial infarction ([place])   . Arthritis   . Carotid stenosis, asymptomatic 12/02/2010  . ARF (acute renal failure) ([facility])     Medications:  Prescriptions prior to admission  Medication Sig Dispense Refill Last Dose  . amLODipine (NORVASC) 5 MG tablet Take 5 mg by mouth daily.    08/27/2015 at Unknown time  . aspirin 81 MG tablet Take 81 mg by mouth daily.   08/27/2015 at Unknown time  . carvedilol (COREG) 25 MG tablet Take 12.5 mg by mouth 2 (two) times daily with a meal.    08/27/2015 at 2000  . cholecalciferol (VITAMIN D) 1000 units tablet Take 1,000 Units by mouth daily.   Past Week at Unknown time  . colchicine 0.6 MG tablet Take 1 tablet by mouth daily as needed.   unkn  . GLIPIZIDE XL 10 MG 24 hr tablet Take 10 mg by mouth daily with breakfast.    08/27/2015  . insulin glargine (LANTUS) 100 UNIT/ML injection Inject 10 Units into the skin at bedtime.   08/27/2015  . levothyroxine (SYNTHROID, LEVOTHROID) 25 MCG tablet Take 25 mcg by mouth daily before breakfast.    08/28/2015 at Unknown time  . lisinopril-hydrochlorothiazide (PRINZIDE,ZESTORETIC) 20-25 MG per tablet Take 1 tablet by mouth daily.    08/27/2015  . Multiple Vitamins-Minerals (MULTIVITAMIN WITH MINERALS) tablet Take 1 tablet by mouth daily.   Past Week at Unknown time  . omeprazole (PRILOSEC) 20 MG capsule Take 20 mg by mouth daily.    Past Week at Unknown time  . pravastatin (PRAVACHOL) 20 MG tablet Take 20 mg by mouth 3 (three) times a week.    Past Week at Unknown time  . saxagliptin HCl (ONGLYZA) 5 MG TABS tablet Take 5 mg by mouth daily.   Past Week at Unknown time  . vitamin B-12 (CYANOCOBALAMIN) 1000 MCG tablet Take 1,000 mcg by mouth daily.   Past Week at Unknown time  . nitroGLYCERIN (NITROSTAT) 0.4 MG SL tablet Place 0.4 mg under the tongue every 5 (five) minutes as needed for chest pain.       Scheduled:  . aspirin  81 mg Oral Daily  . carvedilol  6.25 mg Oral BID WC  . clopidogrel  75 mg Oral Daily  . insulin aspart  0-15 Units Subcutaneous TID WC  . insulin glargine  5 Units Subcutaneous QHS  . insulin glargine  8 Units Subcutaneous Daily  . levothyroxine  25 mcg Oral QAC breakfast  . pantoprazole  40 mg Oral Daily  . pravastatin  20 mg Oral QHS  . sodium chloride flush  3 mL Intravenous Q12H    Assessment: 78 yoM, pharmacy consulted to dose heparin. Pt with possible LV thrombus. NO BOLUS, recent stroke, EF 25-30%. Pt on DAPT, plan to f/u with Dr. Estanislado Pandy as outpatient for possible posterior circulation stent   Goal of Therapy:  Heparin level 0.3-0.7 units/ml Monitor platelets by anticoagulation protocol: Yes   Plan:  NO bolus Start heparin infusion at 1250 units/hr Check anti-Xa level in 8 hours and daily while on heparin Continue to monitor H&H and platelets   Thank you for allowing [place] to participate in this patients care. Jens Som, PharmD Pager: [phone] 09/01/2015,8:28 PM

## 2015-09-01 NOTE — Progress Notes (Signed)
Inpatient Diabetes Program Recommendations  AACE/ADA: New Consensus Statement on Inpatient Glycemic Control (2015)  Target Ranges:  Prepandial:   less than 140 mg/dL      Peak postprandial:   less than 180 mg/dL (1-2 hours)      Critically ill patients:  140 - 180 mg/dL   Results for BYNUM, REIGEL (MRN [id number]) as of 09/01/2015 08:52  Ref. Range 08/31/2015 06:25 08/31/2015 11:11 08/31/2015 17:08  Glucose-Capillary Latest Ref Range: 65-99 mg/dL 194 (H) 289 (H) 270 (H)   Review of Glycemic Control  Diabetes history: DM 2 Outpatient Diabetes medications: Lantus 10 units QHS, Onglyza 5 mg Daily, Glipizide 10 Daily, Metformin 500 BID Current orders for Inpatient glycemic control: Lantus 8 units QAM, 5 units QPM, Novolog Moderate TID  Inpatient Diabetes Program Recommendations: Insulin - Basal: Glucose increased during the day yesterday into the 200's. Fasting glucose 214 this am. Consider increasing morning basal dose to 10 units. Insulin - Meal Coverage: May want to consider starting Novolog 3 units TID meal coverage if patient consumes at least 50% of meals.  Thanks,  Tama Headings RN, MSN, [facility] Inpatient Diabetes Coordinator Team Pager [phone] (8a-5p)

## 2015-09-02 ENCOUNTER — Observation Stay (HOSPITAL_BASED_OUTPATIENT_CLINIC_OR_DEPARTMENT_OTHER): Payer: Medicare Other

## 2015-09-02 DIAGNOSIS — R55 Syncope and collapse: Secondary | ICD-10-CM | POA: Diagnosis not present

## 2015-09-02 DIAGNOSIS — G459 Transient cerebral ischemic attack, unspecified: Secondary | ICD-10-CM | POA: Diagnosis not present

## 2015-09-02 DIAGNOSIS — I6521 Occlusion and stenosis of right carotid artery: Secondary | ICD-10-CM | POA: Diagnosis not present

## 2015-09-02 DIAGNOSIS — I429 Cardiomyopathy, unspecified: Secondary | ICD-10-CM | POA: Diagnosis not present

## 2015-09-02 DIAGNOSIS — E785 Hyperlipidemia, unspecified: Secondary | ICD-10-CM | POA: Diagnosis not present

## 2015-09-02 DIAGNOSIS — I6789 Other cerebrovascular disease: Secondary | ICD-10-CM

## 2015-09-02 DIAGNOSIS — N178 Other acute kidney failure: Secondary | ICD-10-CM

## 2015-09-02 DIAGNOSIS — E038 Other specified hypothyroidism: Secondary | ICD-10-CM | POA: Diagnosis not present

## 2015-09-02 DIAGNOSIS — G458 Other transient cerebral ischemic attacks and related syndromes: Secondary | ICD-10-CM | POA: Diagnosis not present

## 2015-09-02 LAB — CBC
HCT: 36.6 % — ABNORMAL LOW (ref 39.0–52.0)
Hemoglobin: 12.1 g/dL — ABNORMAL LOW (ref 13.0–17.0)
MCH: 30.8 pg (ref 26.0–34.0)
MCHC: 33.1 g/dL (ref 30.0–36.0)
MCV: 93.1 fL (ref 78.0–100.0)
PLATELETS: 159 10*3/uL (ref 150–400)
RBC: 3.93 MIL/uL — AB (ref 4.22–5.81)
RDW: 12.6 % (ref 11.5–15.5)
WBC: 9.2 10*3/uL (ref 4.0–10.5)

## 2015-09-02 LAB — BASIC METABOLIC PANEL
ANION GAP: 11 (ref 5–15)
BUN: 18 mg/dL (ref 6–20)
CHLORIDE: 104 mmol/L (ref 101–111)
CO2: 22 mmol/L (ref 22–32)
Calcium: 9 mg/dL (ref 8.9–10.3)
Creatinine, Ser: 1.46 mg/dL — ABNORMAL HIGH (ref 0.61–1.24)
GFR, EST AFRICAN AMERICAN: 51 mL/min — AB (ref >60)
GFR, EST NON AFRICAN AMERICAN: 44 mL/min — AB (ref >60)
Glucose, Bld: 173 mg/dL — ABNORMAL HIGH (ref 65–99)
POTASSIUM: 4.3 mmol/L (ref 3.5–5.1)
SODIUM: 137 mmol/L (ref 135–145)

## 2015-09-02 LAB — ECHOCARDIOGRAM LIMITED
HEIGHTINCHES: 68 in
WEIGHTICAEL: 2784 [oz_av]

## 2015-09-02 LAB — GLUCOSE, CAPILLARY
GLUCOSE-CAPILLARY: 225 mg/dL — AB (ref 65–99)
Glucose-Capillary: 142 mg/dL — ABNORMAL HIGH (ref 65–99)

## 2015-09-02 LAB — HEPARIN LEVEL (UNFRACTIONATED): HEPARIN UNFRACTIONATED: 0.19 [IU]/mL — AB (ref 0.30–0.70)

## 2015-09-02 MED ORDER — LISINOPRIL 5 MG PO TABS: 5.0000 mg | ORAL_TABLET | Freq: Every day | ORAL | Status: AC

## 2015-09-02 MED ORDER — CLOPIDOGREL BISULFATE 75 MG PO TABS: 75.0000 mg | ORAL_TABLET | Freq: Every day | ORAL | Status: DC

## 2015-09-02 MED ORDER — ZOLPIDEM TARTRATE 5 MG PO TABS
5.0000 mg | ORAL_TABLET | Freq: Once | ORAL | Status: AC
  Administered 2015-09-02: 5 mg via ORAL
  Filled 2015-09-02: qty 1

## 2015-09-02 MED ORDER — CARVEDILOL 6.25 MG PO TABS: 6.2500 mg | ORAL_TABLET | Freq: Two times a day (BID) | ORAL | Status: DC

## 2015-09-02 MED FILL — Perflutren Lipid Microsphere IV Susp 1.1 MG/ML: INTRAVENOUS | Qty: 10 | Status: AC

## 2015-09-02 NOTE — Progress Notes (Signed)
Discharge orders received. Pt and family educated on discharge instructions and stroke education. Pt verbalized understanding. Pt given discharge packet and prescription. IV and tele removed. Pt's wife and daughter requested to speak with MD. MD paged and spoke with family over the phone. Pt and belongings taken downstairs by staff via volunteers.

## 2015-09-02 NOTE — Progress Notes (Addendum)
ANTICOAGULATION CONSULT NOTE - Follow Up Consult  Pharmacy Consult for Heparin  Indication: Possible LV thrombus  Allergies  Allergen Reactions  . Statins Other (See Comments)    Joint pain    Patient Measurements: Height: 5\' 8"  (172.7 cm) Weight: 174 lb (78.926 kg) IBW/kg (Calculated) : 68.4  Vital Signs: Temp: 97.9 F (36.6 C) (04/13 0516) Temp Source: Oral (04/13 0516) BP: 120/70 mmHg (04/13 0516) Pulse Rate: 89 (04/13 0516)  Labs:  Recent Labs  08/30/15 0731 08/31/15 0859 09/01/15 0522 09/02/15 0526  HGB  --   --   --  12.1*  HCT  --   --   --  36.6*  PLT  --   --   --  159  APTT 27  --   --   --   LABPROT 13.9  --   --   --   INR 1.05  --   --   --   HEPARINUNFRC  --   --   --  0.19*  CREATININE  --  1.68* 1.43* 1.46*    Estimated Creatinine Clearance: 39 mL/min (by C-G formula based on Cr of 1.46).  Assessment: Possible LV thrombus, initial heparin level is slightly low at 0.19, no issues per RN.   Goal of Therapy:  Heparin level 0.3-0.5 units/mL Monitor platelets by anticoagulation protocol: Yes   Plan:  -No bolus -Increase heparin to 1350 units/hr -1400 HL  Narda Bonds 09/02/2015,6:13 AM

## 2015-09-02 NOTE — Discharge Instructions (Signed)
Please note that we have stopped your Blood pressure medication Amlodipine and Lisinopril-Hydrochlorothiazide  Also we have changed the dose of Coreg to 6.25 mg to take twice daily  Also we have added Lisinopril 5 mg tablet to take once daily  Please cardiologist in next 1-2 weeks    Heart Failure Heart failure is a condition in which the heart has trouble pumping blood. This means your heart does not pump blood efficiently for your body to work well. In some cases of heart failure, fluid may back up into your lungs or you may have swelling (edema) in your lower legs. Heart failure is usually a long-term (chronic) condition. It is important for you to take good care of yourself and follow your health care provider's treatment plan. CAUSES  Some health conditions can cause heart failure. Those health conditions include:  High blood pressure (hypertension). Hypertension causes the heart muscle to work harder than normal. When pressure in the blood vessels is high, the heart needs to pump (contract) with more force in order to circulate blood throughout the body. High blood pressure eventually causes the heart to become stiff and weak.  Coronary artery disease (CAD). CAD is the buildup of cholesterol and fat (plaque) in the arteries of the heart. The blockage in the arteries deprives the heart muscle of oxygen and blood. This can cause chest pain and may lead to a heart attack. High blood pressure can also contribute to CAD.  Heart attack (myocardial infarction). A heart attack occurs when one or more arteries in the heart become blocked. The loss of oxygen damages the muscle tissue of the heart. When this happens, part of the heart muscle dies. The injured tissue does not contract as well and weakens the heart's ability to pump blood.  Abnormal heart valves. When the heart valves do not open and close properly, it can cause heart failure. This makes the heart muscle pump harder to keep the blood  flowing.  Heart muscle disease (cardiomyopathy or myocarditis). Heart muscle disease is damage to the heart muscle from a variety of causes. These can include drug or alcohol abuse, infections, or unknown reasons. These can increase the risk of heart failure.  Lung disease. Lung disease makes the heart work harder because the lungs do not work properly. This can cause a strain on the heart, leading it to fail.  Diabetes. Diabetes increases the risk of heart failure. High blood sugar contributes to high fat (lipid) levels in the blood. Diabetes can also cause slow damage to tiny blood vessels that carry important nutrients to the heart muscle. When the heart does not get enough oxygen and food, it can cause the heart to become weak and stiff. This leads to a heart that does not contract efficiently.  Other conditions can contribute to heart failure. These include abnormal heart rhythms, thyroid problems, and low blood counts (anemia). Certain unhealthy behaviors can increase the risk of heart failure, including:  Being overweight.  Smoking or chewing tobacco.  Eating foods high in fat and cholesterol.  Abusing illicit drugs or alcohol.  Lacking physical activity. SYMPTOMS  Heart failure symptoms may vary and can be hard to detect. Symptoms may include:  Shortness of breath with activity, such as climbing stairs.  Persistent cough.  Swelling of the feet, ankles, legs, or abdomen.  Unexplained weight gain.  Difficulty breathing when lying flat (orthopnea).  Waking from sleep because of the need to sit up and get more air.  Rapid heartbeat.  Fatigue and loss of energy.  Feeling light-headed, dizzy, or close to fainting.  Loss of appetite.  Nausea.  Increased urination during the night (nocturia). DIAGNOSIS  A diagnosis of heart failure is based on your history, symptoms, physical examination, and diagnostic tests. Diagnostic tests for heart failure may  include:  Echocardiography.  Electrocardiography.  Chest X-ray.  Blood tests.  Exercise stress test.  Cardiac angiography.  Radionuclide scans. TREATMENT  Treatment is aimed at managing the symptoms of heart failure. Medicines, behavioral changes, or surgical intervention may be necessary to treat heart failure.  Medicines to help treat heart failure may include:  Angiotensin-converting enzyme (ACE) inhibitors. This type of medicine blocks the effects of a blood protein called angiotensin-converting enzyme. ACE inhibitors relax (dilate) the blood vessels and help lower blood pressure.  Angiotensin receptor blockers (ARBs). This type of medicine blocks the actions of a blood protein called angiotensin. Angiotensin receptor blockers dilate the blood vessels and help lower blood pressure.  Water pills (diuretics). Diuretics cause the kidneys to remove salt and water from the blood. The extra fluid is removed through urination. This loss of extra fluid lowers the volume of blood the heart pumps.  Beta blockers. These prevent the heart from beating too fast and improve heart muscle strength.  Digitalis. This increases the force of the heartbeat.  Healthy behavior changes include:  Obtaining and maintaining a healthy weight.  Stopping smoking or chewing tobacco.  Eating heart-healthy foods.  Limiting or avoiding alcohol.  Stopping illicit drug use.  Physical activity as directed by your health care provider.  Surgical treatment for heart failure may include:  A procedure to open blocked arteries, repair damaged heart valves, or remove damaged heart muscle tissue.  A pacemaker to improve heart muscle function and control certain abnormal heart rhythms.  An internal cardioverter defibrillator to treat certain serious abnormal heart rhythms.  A left ventricular assist device (LVAD) to assist the pumping ability of the heart. HOME CARE INSTRUCTIONS   Take medicines only  as directed by your health care provider. Medicines are important in reducing the workload of your heart, slowing the progression of heart failure, and improving your symptoms.  Do not stop taking your medicine unless directed by your health care provider.  Do not skip any dose of medicine.  Refill your prescriptions before you run out of medicine. Your medicines are needed every day.  Engage in moderate physical activity if directed by your health care provider. Moderate physical activity can benefit some people. The elderly and people with severe heart failure should consult with a health care provider for physical activity recommendations.  Eat heart-healthy foods. Food choices should be free of trans fat and low in saturated fat, cholesterol, and salt (sodium). Healthy choices include fresh or frozen fruits and vegetables, fish, lean meats, legumes, fat-free or low-fat dairy products, and whole grain or high fiber foods. Talk to a dietitian to learn more about heart-healthy foods.  Limit sodium if directed by your health care provider. Sodium restriction may reduce symptoms of heart failure in some people. Talk to a dietitian to learn more about heart-healthy seasonings.  Use healthy cooking methods. Healthy cooking methods include roasting, grilling, broiling, baking, poaching, steaming, or stir-frying. Talk to a dietitian to learn more about healthy cooking methods.  Limit fluids if directed by your health care provider. Fluid restriction may reduce symptoms of heart failure in some people.  Weigh yourself every day. Daily weights are important in the early recognition of excess  fluid. You should weigh yourself every morning after you urinate and before you eat breakfast. Wear the same amount of clothing each time you weigh yourself. Record your daily weight. Provide your health care provider with your weight record.  Monitor and record your blood pressure if directed by your health care  provider.  Check your pulse if directed by your health care provider.  Lose weight if directed by your health care provider. Weight loss may reduce symptoms of heart failure in some people.  Stop smoking or chewing tobacco. Nicotine makes your heart work harder by causing your blood vessels to constrict. Do not use nicotine gum or patches before talking to your health care provider.  Keep all follow-up visits as directed by your health care provider. This is important.  Limit alcohol intake to no more than 1 drink per day for nonpregnant women and 2 drinks per day for men. One drink equals 12 ounces of beer, 5 ounces of wine, or 1 ounces of hard liquor. Drinking more than that is harmful to your heart. Tell your health care provider if you drink alcohol several times a week. Talk with your health care provider about whether alcohol is safe for you. If your heart has already been damaged by alcohol or you have severe heart failure, drinking alcohol should be stopped completely.  Stop illicit drug use.  Stay up-to-date with immunizations. It is especially important to prevent respiratory infections through current pneumococcal and influenza immunizations.  Manage other health conditions such as hypertension, diabetes, thyroid disease, or abnormal heart rhythms as directed by your health care provider.  Learn to manage stress.  Plan rest periods when fatigued.  Learn strategies to manage high temperatures. If the weather is extremely hot:  Avoid vigorous physical activity.  Use air conditioning or fans or seek a cooler location.  Avoid caffeine and alcohol.  Wear loose-fitting, lightweight, and light-colored clothing.  Learn strategies to manage cold temperatures. If the weather is extremely cold:  Avoid vigorous physical activity.  Layer clothes.  Wear mittens or gloves, a hat, and a scarf when going outside.  Avoid alcohol.  Obtain ongoing education and support as  needed.  Participate in or seek rehabilitation as needed to maintain or improve independence and quality of life. SEEK MEDICAL CARE IF:   You have a rapid weight gain.  You have increasing shortness of breath that is unusual for you.  You are unable to participate in your usual physical activities.  You tire easily.  You cough more than normal, especially with physical activity.  You have any or more swelling in areas such as your hands, feet, ankles, or abdomen.  You are unable to sleep because it is hard to breathe.  You feel like your heart is beating fast (palpitations).  You become dizzy or light-headed upon standing up. SEEK IMMEDIATE MEDICAL CARE IF:   You have difficulty breathing.  There is a change in mental status such as decreased alertness or difficulty with concentration.  You have a pain or discomfort in your chest.  You have an episode of fainting (syncope). MAKE SURE YOU:   Understand these instructions.  Will watch your condition.  Will get help right away if you are not doing well or get worse.   This information is not intended to replace advice given to you by your health care provider. Make sure you discuss any questions you have with your health care provider.   Document Released: 05/08/2005 Document Revised: 09/22/2014 Document  Reviewed: 06/07/2012 Elsevier Interactive Patient Education 2016 [employer]   Aspirin and Your Heart  Aspirin is a medicine that affects the way blood clots. Aspirin can be used to help reduce the risk of blood clots, heart attacks, and other heart-related problems.  SHOULD I TAKE ASPIRIN? Your health care provider will help you determine whether it is safe and beneficial for you to take aspirin daily. Taking aspirin daily may be beneficial if you:  Have had a heart attack or chest pain.  Have undergone open heart surgery such as coronary artery bypass surgery (CABG).  Have had coronary angioplasty.  Have  experienced a stroke or transient ischemic attack (TIA).  Have peripheral vascular disease (PVD).  Have chronic heart rhythm problems such as atrial fibrillation. ARE THERE ANY RISKS OF TAKING ASPIRIN DAILY? Daily use of aspirin can increase your risk of side effects. Some of these include:  Bleeding. Bleeding problems can be minor or serious. An example of a minor problem is a cut that does not stop bleeding. An example of a more serious problem is stomach bleeding or bleeding into the brain. Your risk of bleeding is increased if you are also taking non-steroidal anti-inflammatory medicine (NSAIDs).  Increased bruising.  Upset stomach.  An allergic reaction. People who have nasal polyps have an increased risk of developing an aspirin allergy. WHAT ARE SOME GUIDELINES I SHOULD FOLLOW WHEN TAKING ASPIRIN?   Take aspirin only as directed by your health care provider. Make sure you understand how much you should take and what form you should take. The two forms of aspirin are:  Non-enteric-coated. This type of aspirin does not have a coating and is absorbed quickly. Non-enteric-coated aspirin is usually recommended for people with chest pain. This type of aspirin also comes in a chewable form.  Enteric-coated. This type of aspirin has a special coating that releases the medicine very slowly. Enteric-coated aspirin causes less stomach upset than non-enteric-coated aspirin. This type of aspirin should not be chewed or crushed.  Drink alcohol in moderation. Drinking alcohol increases your risk of bleeding. WHEN SHOULD I SEEK MEDICAL CARE?   You have unusual bleeding or bruising.  You have stomach pain.  You have an allergic reaction. Symptoms of an allergic reaction include:  Hives.  Itchy skin.  Swelling of the lips, tongue, or face.  You have ringing in your ears. WHEN SHOULD I SEEK IMMEDIATE MEDICAL CARE?   Your bowel movements are bloody, dark red, or black in color.  You  vomit or cough up blood.  You have blood in your urine.  You cough, wheeze, or feel short of breath. If you have any of the following symptoms, this is an emergency. Do not wait to see if the pain will go away. Get medical help at once. Call your local emergency services (911 in the [place] Do not drive yourself to the hospital.  You have severe chest pain, especially if the pain is crushing or pressure-like and spreads to the arms, back, neck, or jaw.  You have stroke-like symptoms, such as:   Loss of vision.   Difficulty talking.   Numbness or weakness on one side of your body.   Numbness or weakness in your arm or leg.   Not thinking clearly or feeling confused.    This information is not intended to replace advice given to you by your health care provider. Make sure you discuss any questions you have with your health care provider.   Document Released: 04/20/2008  Document Revised: 05/29/2014 Document Reviewed: 08/13/2013 Elsevier Interactive Patient Education [employer].

## 2015-09-02 NOTE — Progress Notes (Addendum)
DAILY PROGRESS NOTE  Subjective:  No events overnight. Feels well today. I was present for and supervised a limited Definity contrast echo today which did not show any apical thrombus (indicating that the original images were artifact). Discussed further with Dr. Leonel Ramsay (neurology) -this does not appear to be new stroke, rather syncope/pre-syncope and possible TIA.  Objective:  Temp:  [97.9 F (36.6 C)-98.7 F (37.1 C)] 97.9 F (36.6 C) (04/13 0516) Pulse Rate:  [76-89] 89 (04/13 0516) Resp:  [16-20] 20 (04/13 0516) BP: (102-155)/(40-70) 120/70 mmHg (04/13 0516) SpO2:  [98 %-100 %] 98 % (04/13 0516) Weight change:   Intake/Output from previous day: 04/12 0701 - 04/13 0700 In: 480 [P.O.:480] Out: -   Intake/Output from this shift: Total I/O In: 240 [P.O.:240] Out: -   Medications: Current Facility-Administered Medications  Medication Dose Route Frequency Provider Last Rate Last Dose  . alum & mag hydroxide-simeth (MAALOX/MYLANTA) 200-200-20 MG/5ML suspension 30 mL  30 mL Oral Q6H PRN Bonnielee Haff, MD   30 mL at 09/01/15 1246  . aspirin chewable tablet 81 mg  81 mg Oral Daily Ram Fuller Mandril, MD   81 mg at 09/01/15 0955  . carvedilol (COREG) tablet 6.25 mg  6.25 mg Oral BID WC Theodis Blaze, MD   6.25 mg at 09/02/15 0815  . clopidogrel (PLAVIX) tablet 75 mg  75 mg Oral Daily Ram Fuller Mandril, MD   75 mg at 09/01/15 0955  . heparin ADULT infusion 100 units/mL (25000 units/250 mL)  1,350 Units/hr Intravenous Continuous Erenest Blank, RPH 13.5 mL/hr at 09/02/15 0620 1,350 Units/hr at 09/02/15 5003  . hydrALAZINE (APRESOLINE) injection 5 mg  5 mg Intravenous Q6H PRN Bonnielee Haff, MD      . insulin aspart (novoLOG) injection 0-15 Units  0-15 Units Subcutaneous TID WC Reubin Milan, MD   2 Units at 09/02/15 0750  . insulin glargine (LANTUS) injection 5 Units  5 Units Subcutaneous QHS Bonnielee Haff, MD   5 Units at 09/01/15 2215  . insulin  glargine (LANTUS) injection 8 Units  8 Units Subcutaneous Daily Bonnielee Haff, MD   8 Units at 09/01/15 0954  . levothyroxine (SYNTHROID, LEVOTHROID) tablet 25 mcg  25 mcg Oral QAC breakfast Reubin Milan, MD   25 mcg at 09/02/15 0815  . menthol-cetylpyridinium (CEPACOL) lozenge 3 mg  1 lozenge Oral PRN Rhetta Mura Schorr, NP      . ondansetron [facility]) tablet 4 mg  4 mg Oral Q6H PRN Reubin Milan, MD       Or  . ondansetron [facility]) injection 4 mg  4 mg Intravenous Q6H PRN Reubin Milan, MD      . pantoprazole (PROTONIX) EC tablet 40 mg  40 mg Oral Daily Reubin Milan, MD   40 mg at 09/01/15 0955  . pravastatin (PRAVACHOL) tablet 20 mg  20 mg Oral QHS Reubin Milan, MD   20 mg at 09/01/15 2101  . senna-docusate (Senokot-S) tablet 1 tablet  1 tablet Oral QHS PRN Reubin Milan, MD      . sodium chloride flush (NS) 0.9 % injection 3 mL  3 mL Intravenous Q12H Reubin Milan, MD   3 mL at 09/01/15 2101    Physical Exam: General appearance: alert and no distress Lungs: clear to auscultation bilaterally Heart: regular rate and rhythm, S1, S2 normal and systolic murmur: early systolic 3/6, blowing at apex Extremities: extremities normal, atraumatic, no cyanosis or edema  Lab Results: Results for orders placed or performed during the hospital encounter of 08/28/15 (from the past 48 hour(s))  Glucose, capillary     Status: Abnormal   Collection Time: 08/31/15 11:11 AM  Result Value Ref Range   Glucose-Capillary 289 (H) 65 - 99 mg/dL  Urinalysis, Routine w reflex microscopic (not at [facility])     Status: Abnormal   Collection Time: 08/31/15  3:54 PM  Result Value Ref Range   Color, Urine YELLOW YELLOW   APPearance CLEAR CLEAR   Specific Gravity, Urine 1.010 1.005 - 1.030   pH 7.0 5.0 - 8.0   Glucose, UA NEGATIVE NEGATIVE mg/dL   Hgb urine dipstick NEGATIVE NEGATIVE   Bilirubin Urine NEGATIVE NEGATIVE   Ketones, ur NEGATIVE NEGATIVE mg/dL   Protein, ur 100 (A)  NEGATIVE mg/dL   Nitrite NEGATIVE NEGATIVE   Leukocytes, UA NEGATIVE NEGATIVE  Urine microscopic-add on     Status: Abnormal   Collection Time: 08/31/15  3:54 PM  Result Value Ref Range   Squamous Epithelial / LPF 0-5 (A) NONE SEEN   WBC, UA 0-5 0 - 5 WBC/hpf   RBC / HPF 0-5 0 - 5 RBC/hpf   Bacteria, UA RARE (A) NONE SEEN  Glucose, capillary     Status: Abnormal   Collection Time: 08/31/15  5:08 PM  Result Value Ref Range   Glucose-Capillary 270 (H) 65 - 99 mg/dL  Glucose, capillary     Status: Abnormal   Collection Time: 08/31/15 10:13 PM  Result Value Ref Range   Glucose-Capillary 331 (H) 65 - 99 mg/dL   Comment 1 Notify RN    Comment 2 Document in Chart   Basic metabolic panel     Status: Abnormal   Collection Time: 09/01/15  5:22 AM  Result Value Ref Range   Sodium 137 135 - 145 mmol/L   Potassium 4.1 3.5 - 5.1 mmol/L   Chloride 104 101 - 111 mmol/L   CO2 22 22 - 32 mmol/L   Glucose, Bld 242 (H) 65 - 99 mg/dL   BUN 21 (H) 6 - 20 mg/dL   Creatinine, Ser 1.43 (H) 0.61 - 1.24 mg/dL   Calcium 8.4 (L) 8.9 - 10.3 mg/dL   GFR calc non Af Amer 45 (L) >60 mL/min   GFR calc Af Amer 52 (L) >60 mL/min    Comment: (NOTE) The eGFR has been calculated using the CKD EPI equation. This calculation has not been validated in all clinical situations. eGFR's persistently <60 mL/min signify possible Chronic Kidney Disease.    Anion gap 11 5 - 15  Glucose, capillary     Status: Abnormal   Collection Time: 09/01/15  6:36 AM  Result Value Ref Range   Glucose-Capillary 214 (H) 65 - 99 mg/dL   Comment 1 Notify RN    Comment 2 Document in Chart   Glucose, capillary     Status: Abnormal   Collection Time: 09/01/15 11:38 AM  Result Value Ref Range   Glucose-Capillary 257 (H) 65 - 99 mg/dL  Glucose, capillary     Status: Abnormal   Collection Time: 09/01/15  4:41 PM  Result Value Ref Range   Glucose-Capillary 271 (H) 65 - 99 mg/dL   Comment 1 Notify RN    Comment 2 Document in Chart     Glucose, capillary     Status: Abnormal   Collection Time: 09/01/15  9:22 PM  Result Value Ref Range   Glucose-Capillary 120 (H) 65 - 99 mg/dL  Comment 1 Notify RN    Comment 2 Document in Chart   CBC     Status: Abnormal   Collection Time: 09/02/15  5:26 AM  Result Value Ref Range   WBC 9.2 4.0 - 10.5 K/uL   RBC 3.93 (L) 4.22 - 5.81 MIL/uL   Hemoglobin 12.1 (L) 13.0 - 17.0 g/dL   HCT 36.6 (L) 39.0 - 52.0 %   MCV 93.1 78.0 - 100.0 fL   MCH 30.8 26.0 - 34.0 pg   MCHC 33.1 30.0 - 36.0 g/dL   RDW 12.6 11.5 - 15.5 %   Platelets 159 150 - 400 K/uL  Basic metabolic panel     Status: Abnormal   Collection Time: 09/02/15  5:26 AM  Result Value Ref Range   Sodium 137 135 - 145 mmol/L   Potassium 4.3 3.5 - 5.1 mmol/L   Chloride 104 101 - 111 mmol/L   CO2 22 22 - 32 mmol/L   Glucose, Bld 173 (H) 65 - 99 mg/dL   BUN 18 6 - 20 mg/dL   Creatinine, Ser 1.46 (H) 0.61 - 1.24 mg/dL   Calcium 9.0 8.9 - 10.3 mg/dL   GFR calc non Af Amer 44 (L) >60 mL/min   GFR calc Af Amer 51 (L) >60 mL/min    Comment: (NOTE) The eGFR has been calculated using the CKD EPI equation. This calculation has not been validated in all clinical situations. eGFR's persistently <60 mL/min signify possible Chronic Kidney Disease.    Anion gap 11 5 - 15  Heparin level (unfractionated)     Status: Abnormal   Collection Time: 09/02/15  5:26 AM  Result Value Ref Range   Heparin Unfractionated 0.19 (L) 0.30 - 0.70 IU/mL    Comment:        IF HEPARIN RESULTS ARE BELOW EXPECTED VALUES, AND PATIENT DOSAGE HAS BEEN CONFIRMED, SUGGEST FOLLOW UP TESTING OF ANTITHROMBIN III LEVELS.   Glucose, capillary     Status: Abnormal   Collection Time: 09/02/15  6:36 AM  Result Value Ref Range   Glucose-Capillary 142 (H) 65 - 99 mg/dL   Comment 1 Notify RN    Comment 2 Document in Chart     Imaging: US Renal  08/31/2015  CLINICAL DATA:  Acute onset of renal failure.  Initial encounter. EXAM: RENAL / URINARY TRACT ULTRASOUND  COMPLETE COMPARISON:  None. FINDINGS: Right Kidney: Length: 12.0 cm. Echogenicity within normal limits. No mass or hydronephrosis visualized. Left Kidney: Length: 9.1 cm. Echogenicity within normal limits. Mild cortical thinning is noted. No mass or hydronephrosis visualized. Bladder: Appears normal for degree of bladder distention. Scattered calcified granulomata are noted within the spleen. IMPRESSION: 1. No evidence of hydronephrosis. 2. Mild left renal cortical thinning may reflect chronic renal disease or underlying mild atrophy. Electronically Signed   By: Garald Balding M.D.   On: 08/31/2015 21:26    Assessment:  Principal Problem:   TIA (transient ischemic attack) Active Problems:   HTN (hypertension)   Carotid stenosis   DM type 2 (diabetes mellitus, type 2) (HCC)   Hyperlipidemia   Hypothyroidism   ARF (acute renal failure) (HCC)   Carotid occlusion, right   Syncope and collapse   Cardiomyopathy ([place])   Plan:  1. Cardiomyopathy, probably ischemic based on history. Today's study demonstrates no evidence of apical thrombus, therefore will d/c heparin, however EF is quite low at 25%. No indication for long-term warfarin. Would continue aspirin, plavix and carvedilol. Restart low dose lisinopril as BP allows.  Cardiac etiologies of syncope could include arrythmia or possible orthostatic hypotension. May need an ILR for further work-up, which could be arranged through his cardiologist in [place]. He will also likely need cardiac catheterization or ischemia evaluation for possible progressive CAD as a cause of his cardiomyopathy. Would recommend follow-up with his cardiologist in [place] for this testing in the next 1-2 weeks.  Cardiology will sign-off. Call with questions.   Time Spent Directly with Patient:  15 minutes  Length of Stay:  LOS: 5 days   Pixie Casino, MD, [facility] Attending Cardiologist [place] 09/02/2015, 9:04 AM

## 2015-09-02 NOTE — Care Management Note (Signed)
Case Management Note  Patient Details  Name: Johnny Murphy MRN: [id number] Date of Birth: [birth year]  Subjective/Objective:                    Action/Plan: Patient discharging home with self care. No further needs per CM.   Expected Discharge Date:                  Expected Discharge Plan:  Home/Self Care  In-House Referral:     Discharge planning Services     Post Acute Care Choice:    Choice offered to:     DME Arranged:    DME Agency:     HH Arranged:    [place] Agency:     Status of Service:  Completed, signed off  Medicare Important Message Given:    Date Medicare IM Given:    Medicare IM give by:    Date Additional Medicare IM Given:    Additional Medicare Important Message give by:     If discussed at [place] of Stay Meetings, dates discussed:    Additional Comments:  Pollie Friar, RN 09/02/2015, 10:40 AM

## 2015-09-02 NOTE — Progress Notes (Signed)
  Echocardiogram 2D Echocardiogram Limited with Definity 42mL administered  by Dr. Debara Pickett has been performed.  Johnny Murphy 09/02/2015, 9:03 AM

## 2015-09-02 NOTE — Discharge Summary (Signed)
Physician Discharge Summary  Johnny Murphy [id number] DOB: 1932-07-23 DOA: 08/28/2015  PCP: Gilford Rile, MD  Admit date: 08/28/2015 Discharge date: 09/02/2015  Recommendations for Outpatient Follow-up:  1. Pt will need to follow up with PCP in 1-2 weeks post discharge 2. Pt also advised to see his Cardiologist in [place] for consideration of cardiac cath  3. Pt also to follow up with Dr. Estanislado Pandy, I will route this note to him and ask his office staff to schedule pt's appointment  4. Please obtain BMP to evaluate electrolytes and kidney function 5. Please also check CBC to evaluate Hg and Hct levels 6. Please note that there was no evidence of apical thrombus on ECHO therefore d/c heparin, however EF is quite low at 25%. No indication for long-term warfarin. Per cardiologist recommendation continue aspirin, plavix and carvedilol.  7. May need an ILR for further work-up, which could be arranged through his cardiologist in [place]. He will also likely need cardiac catheterization or ischemia evaluation for possible progressive CAD as a cause of his cardiomyopathy. Would recommend follow-up with his cardiologist in [place] for this testing in the next 1-2 weeks.  Discharge Diagnoses:  Principal Problem:   TIA (transient ischemic attack) Active Problems:   HTN (hypertension)   Carotid stenosis   DM type 2 (diabetes mellitus, type 2) (HCC)   Hyperlipidemia   Hypothyroidism   ARF (acute renal failure) (HCC)   Carotid occlusion, right   Syncope and collapse   Cardiomyopathy ([place])  Discharge Condition: Stable  Diet recommendation: Heart healthy diet discussed in details   History of present illness:  82 year old Caucasian male with a past medical history of mild dementia, hypertension, carotid artery stenosis, diabetes, hypothyroidism, was eating breakfast at a restaurant when he suddenly passed out. EMS noted that he had left-sided weakness. The symptoms resolved after he came into  the emergency department. Apparently, patient was unresponsive for almost 45 minutes per family.  Hospital Course:  Syncope - underwent cerebral angiogram 4/10. Evaluation has revealed bilateral vertebral artery stenosis. 70-75% on the left and completely occluded on the right.  - Occluded right internal carotid artery - MRI of the brain 08/29/2015 with multiple remote lacunar infarcts of the basal ganglia and cerebellum bilaterally, remote small cortical infarcts of the right parietal lobe - TTE with EF 25 %, please cardiologist recommendations above   Chronic CHF - EF 25 % per ECHO - cardiology consulted  - recommended close outpatient follow up   History of carotid artery stenosis - CT angiogram report from outside facilities apparently shows significant stenosis  - Cerebral angiogram done 4/10. - continue aspirin and plavix   Chronic kidney disease, stage III - ? Of acute vs chronic kidney disease  - unknown baseline but please note that pt meets criteria for CKD with underlying HTN, uncontrolled DM with A1C > 11, chronic systolic CHF, renal US wit thinning or renal cortices which is c/w chronic medical renal disease - I suspect based on the above mentioned factors, pt falls into category of CKD - pt did however receive contrast but this alone would not cause thinning of the renal cortices seen on renal US   Essential hypertension - continue Coreg at the lower dose and adjust as BP tolerates  - continue Lisinopril   Type 2 diabetes with complications of CKD stage III and long term use insulin  - Continue current regimen of Lantus.  - Continue sliding scale coverage. HbA1c 11.6.  Hypothyroidism - Continue his home medications. TSH  2.51.  History of hyperlipidemia - Continue his home medication  DVT Prophylaxis: Lovenox SQ  Code Status: Full code  Family Communication: Discussed with the patient  Disposition Plan: Plan d/c home once cardiology team clears    LOS: 4  days     Procedures/Studies: Mr Brain Wo Contrast  08/29/2015  CLINICAL DATA:  Syncopal episode yesterday. EXAM: MRI HEAD WITHOUT CONTRAST TECHNIQUE: Multiplanar, multiecho pulse sequences of the brain and surrounding structures were obtained without intravenous contrast. COMPARISON:  CTA of the head and neck 08/28/2015. FINDINGS: The diffusion-weighted images demonstrate no evidence for acute or subacute infarction. Acute hemorrhage or mass lesion is present. Remote lacunar infarcts are present within the basal ganglia and cerebellum bilaterally Moderate periventricular white matter changes are present bilaterally. Focal remote nonhemorrhagic cortical infarcts are present in the right parietal lobe. The internal auditory canals are within normal limits bilaterally. White matter changes extend into the brainstem. A remote lacunar infarct is present in the left thalamus. Moderate generalized atrophy is present. No significant extra-axial fluid collection is present. Abnormal signal in the right vertebral artery below the dural margin and within the cavernous right internal carotid artery compatible with occlusion is identified on the previous study. Flow is otherwise present in the major intracranial arteries. The skullbase is within normal limits. Midline sagittal images are unremarkable. IMPRESSION: 1. No acute intracranial abnormality. 2. Multiple remote lacunar infarcts of the basal ganglia and cerebellum bilaterally. 3. Remote small cortical infarcts of the right parietal lobe. 4. Abnormal signal within the cavernous right internal carotid artery and the right vertebral artery compatible with occlusions identified on the previous study. Flow is present within the right M1 segment. Electronically Signed   By: San Morelle M.D.   On: 08/29/2015 10:31   US Renal  08/31/2015  CLINICAL DATA:  Acute onset of renal failure.  Initial encounter. EXAM: RENAL / URINARY TRACT ULTRASOUND COMPLETE COMPARISON:   None. FINDINGS: Right Kidney: Length: 12.0 cm. Echogenicity within normal limits. No mass or hydronephrosis visualized. Left Kidney: Length: 9.1 cm. Echogenicity within normal limits. Mild cortical thinning is noted. No mass or hydronephrosis visualized. Bladder: Appears normal for degree of bladder distention. Scattered calcified granulomata are noted within the spleen. IMPRESSION: 1. No evidence of hydronephrosis. 2. Mild left renal cortical thinning may reflect chronic renal disease or underlying mild atrophy. Electronically Signed   By: Garald Balding M.D.   On: 08/31/2015 21:26   Ir Angio Intra Extracran Sel Com Carotid Innominate Bilat Mod Sed  08/30/2015  CLINICAL DATA:  Vertebrobasilar ischemia. Syncope. Previous history 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 carotid artery intracranially. EXAM: BILATERAL COMMON CAROTID AND INNOMINATE ANGIOGRAPHY AND BILATERAL VERTEBRAL ARTERY ANGIOGRAMS PROCEDURE: Contrast: 41mL ISOVUE-300 IOPAMIDOL (ISOVUE-300) INJECTION 61%, 16mL ISOVUE-300 IOPAMIDOL (ISOVUE-300) INJECTION 61% Anesthesia/Sedation:  Conscious sedation. Medications: Versed 1 mg IV. Fentanyl 25 mcg IV. Heparin 1000 units IV. Following a full explanation of the procedure along with the potential associated complications, an informed witnessed consent was obtained. The right groin was prepped and draped in the usual sterile fashion. Thereafter using modified Seldinger technique, transfemoral access into the right common femoral artery was obtained without difficulty. Over a 0.035 inch guidewire, a 5 French Pinnacle sheath was inserted. Through this, and also over 0.035 inch guidewire, a 5 [place] JB 1 catheter was advanced to the aortic arch region and selectively positioned in the right common carotid artery, the right subclavian artery, the left common carotid artery and the left subclavian artery. There were  no acute complications. The patient tolerated the procedure well.  FINDINGS: The right common carotid arteriogram demonstrates the right external carotid artery and its major branches to be widely patent. The right internal carotid artery at the bulb and just distal to the bulb demonstrates a long segment irregular atherosclerotic plaque with a shallow ulcer proximally. There is associated approximately 50% stenosis by the NASCET criteria. There is moderate tortuosity of the mid cervical right ICA. More distally patency of the petrous and the cavernous segments is noted. There is complete occlusion of the right internal carotid artery just distal to the origin of the right posterior communicating artery. The delayed arterial phase demonstrates attempt at leptomeningeal collateralization from the P3 segments of the right posterior cerebral artery supplying the posterior parietal right MCA distribution. The right subclavian arteriogram demonstrates complete occlusion of a hypoplastic right vertebral artery at the level of C1. The left common carotid arteriogram demonstrates the left external carotid artery to be severely narrowed by approximately 75%. Its branches however are seen to opacify adequately. Also demonstrated is a shelf-like plaque of the posterolateral aspect of the left internal carotid artery resulting in approximately 30% stenosis by NASCET criteria. There is a U shaped tortuosity of the mid cervical left ICA without evidence of kinking. More distally, the vessel is seen to opacify normally to the cranial skull base. Normal opacification is seen of the petrous, cavernous and the supraclinoid segments. Left posterior communicating artery is seen opacifying the left posterior cerebral artery distribution. The left middle cerebral artery and the left anterior cerebral artery are seen to opacify into the capillary and the venous phases. Prompt simultaneous opacification via the anterior communicating artery of the right anterior cerebral artery A1-A2 segments and  distally, and the right middle cerebral artery distribution is noted. The is a moderate 50% narrowing of the left anterior cerebral artery A1 segment. The left subclavian arteriogram demonstrates approximately 75% percent stenosis of the dominant left vertebral artery at its origin. This is associated with a small plaque just proximal to its origin. Mild-to-moderate irregularity of the subclavian artery proximally is related to atherosclerotic disease. There is mild tortuosity of the left vertebral artery proximally. More distally the vessel is seen to opacify to the cranial skull base. Opacification of the left posterior inferior cerebellar artery is noted. Mild narrowing of the left vertebrobasilar junction just proximal to the basilar artery is noted as well as the mid basilar artery. A tapered narrowing of the distal basilar artery of about 50% is noted as well. The left posterior cerebral artery and the right posterior cerebral artery demonstrate focal areas of caliber irregularity suggestive of intracranial arteriosclerosis. Retrograde opacification of the hypoplastic right vertebrobasilar junction to the right posterior-inferior cerebellar artery is also noted. IMPRESSION: Approximately 75% narrowing of the dominant left vertebral artery at its origin. Occluded right internal carotid artery just distal to the origin of the right posterior communicating artery. Occluded right vertebral artery at the level of C1. Severe stenosis of the left external carotid artery at its origin. Approximately 30% stenosis of the left internal carotid artery at the bulb by the NASCET criteria without associated ulcerations. Approximately 50% stenosis of the distal basilar artery, and to a lesser degree of the mid basilar artery. Mild-to-moderate intracranial arteriosclerosis involving primarily the posterior circulation as described. Electronically Signed   By: Luanne Bras M.D.   On: 08/30/2015 10:27   Ir Angio  Vertebral Sel Subclavian Innominate Bilat Mod Sed  08/30/2015  CLINICAL DATA:  Vertebrobasilar ischemia. Syncope. Previous history 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 carotid artery intracranially. EXAM: BILATERAL COMMON CAROTID AND INNOMINATE ANGIOGRAPHY AND BILATERAL VERTEBRAL ARTERY ANGIOGRAMS PROCEDURE: Contrast: 97mL ISOVUE-300 IOPAMIDOL (ISOVUE-300) INJECTION 61%, 56mL ISOVUE-300 IOPAMIDOL (ISOVUE-300) INJECTION 61% Anesthesia/Sedation:  Conscious sedation. Medications: Versed 1 mg IV. Fentanyl 25 mcg IV. Heparin 1000 units IV. Following a full explanation of the procedure along with the potential associated complications, an informed witnessed consent was obtained. The right groin was prepped and draped in the usual sterile fashion. Thereafter using modified Seldinger technique, transfemoral access into the right common femoral artery was obtained without difficulty. Over a 0.035 inch guidewire, a 5 French Pinnacle sheath was inserted. Through this, and also over 0.035 inch guidewire, a 5 [place] JB 1 catheter was advanced to the aortic arch region and selectively positioned in the right common carotid artery, the right subclavian artery, the left common carotid artery and the left subclavian artery. There were no acute complications. The patient tolerated the procedure well. FINDINGS: The right common carotid arteriogram demonstrates the right external carotid artery and its major branches to be widely patent. The right internal carotid artery at the bulb and just distal to the bulb demonstrates a long segment irregular atherosclerotic plaque with a shallow ulcer proximally. There is associated approximately 50% stenosis by the NASCET criteria. There is moderate tortuosity of the mid cervical right ICA. More distally patency of the petrous and the cavernous segments is noted. There is complete occlusion of the right internal carotid artery just distal to the origin of the right  posterior communicating artery. The delayed arterial phase demonstrates attempt at leptomeningeal collateralization from the P3 segments of the right posterior cerebral artery supplying the posterior parietal right MCA distribution. The right subclavian arteriogram demonstrates complete occlusion of a hypoplastic right vertebral artery at the level of C1. The left common carotid arteriogram demonstrates the left external carotid artery to be severely narrowed by approximately 75%. Its branches however are seen to opacify adequately. Also demonstrated is a shelf-like plaque of the posterolateral aspect of the left internal carotid artery resulting in approximately 30% stenosis by NASCET criteria. There is a U shaped tortuosity of the mid cervical left ICA without evidence of kinking. More distally, the vessel is seen to opacify normally to the cranial skull base. Normal opacification is seen of the petrous, cavernous and the supraclinoid segments. Left posterior communicating artery is seen opacifying the left posterior cerebral artery distribution. The left middle cerebral artery and the left anterior cerebral artery are seen to opacify into the capillary and the venous phases. Prompt simultaneous opacification via the anterior communicating artery of the right anterior cerebral artery A1-A2 segments and distally, and the right middle cerebral artery distribution is noted. The is a moderate 50% narrowing of the left anterior cerebral artery A1 segment. The left subclavian arteriogram demonstrates approximately 75% percent stenosis of the dominant left vertebral artery at its origin. This is associated with a small plaque just proximal to its origin. Mild-to-moderate irregularity of the subclavian artery proximally is related to atherosclerotic disease. There is mild tortuosity of the left vertebral artery proximally. More distally the vessel is seen to opacify to the cranial skull base. Opacification of the left  posterior inferior cerebellar artery is noted. Mild narrowing of the left vertebrobasilar junction just proximal to the basilar artery is noted as well as the mid basilar artery. A tapered narrowing of the distal basilar artery of about 50% is noted as well. The  left posterior cerebral artery and the right posterior cerebral artery demonstrate focal areas of caliber irregularity suggestive of intracranial arteriosclerosis. Retrograde opacification of the hypoplastic right vertebrobasilar junction to the right posterior-inferior cerebellar artery is also noted. IMPRESSION: Approximately 75% narrowing of the dominant left vertebral artery at its origin. Occluded right internal carotid artery just distal to the origin of the right posterior communicating artery. Occluded right vertebral artery at the level of C1. Severe stenosis of the left external carotid artery at its origin. Approximately 30% stenosis of the left internal carotid artery at the bulb by the NASCET criteria without associated ulcerations. Approximately 50% stenosis of the distal basilar artery, and to a lesser degree of the mid basilar artery. Mild-to-moderate intracranial arteriosclerosis involving primarily the posterior circulation as described. Electronically Signed   By: Luanne Bras M.D.   On: 08/30/2015 10:27    Discharge Exam: Filed Vitals:   09/02/15 0151 09/02/15 0516  BP: 102/69 120/70  Pulse: 84 89  Temp: 98.1 F (36.7 C) 97.9 F (36.6 C)  Resp: 20 20   Filed Vitals:   09/01/15 1648 09/01/15 2119 09/02/15 0151 09/02/15 0516  BP: 155/66 153/61 102/69 120/70  Pulse: 79 76 84 89  Temp: 98.5 F (36.9 C) 98 F (36.7 C) 98.1 F (36.7 C) 97.9 F (36.6 C)  TempSrc: Oral Oral Oral Oral  Resp: 20 20 20 20   Height:      Weight:      SpO2: 98% 99% 98% 98%    General: Pt is alert, follows commands appropriately, not in acute distress Cardiovascular: Regular rate and rhythm, no rubs, no gallops Respiratory: Clear to  auscultation bilaterally, no wheezing, no crackles, no rhonchi Abdominal: Soft, non tender, non distended, bowel sounds +, no guarding Extremities: no edema, no cyanosis, pulses palpable bilaterally DP and PT Neuro: Grossly nonfocal  Discharge Instructions  Discharge Instructions    Diet - low sodium heart healthy    Complete by:  As directed      Increase activity slowly    Complete by:  As directed             Medication List    STOP taking these medications        amLODipine 5 MG tablet  Commonly known as:  NORVASC     lisinopril-hydrochlorothiazide 20-25 MG tablet  Commonly known as:  PRINZIDE,ZESTORETIC      TAKE these medications        aspirin 81 MG tablet  Take 81 mg by mouth daily.     carvedilol 6.25 MG tablet  Commonly known as:  COREG  Take 1 tablet (6.25 mg total) by mouth 2 (two) times daily with a meal.     cholecalciferol 1000 units tablet  Commonly known as:  VITAMIN D  Take 1,000 Units by mouth daily.     clopidogrel 75 MG tablet  Commonly known as:  PLAVIX  Take 1 tablet (75 mg total) by mouth daily.     colchicine 0.6 MG tablet  Take 1 tablet by mouth daily as needed.     GLIPIZIDE XL 10 MG 24 hr tablet  Generic drug:  glipiZIDE  Take 10 mg by mouth daily with breakfast.     insulin glargine 100 UNIT/ML injection  Commonly known as:  LANTUS  Inject 10 Units into the skin at bedtime.     levothyroxine 25 MCG tablet  Commonly known as:  SYNTHROID, LEVOTHROID  Take 25 mcg by mouth daily before breakfast.  lisinopril 5 MG tablet  Commonly known as:  ZESTRIL  Take 1 tablet (5 mg total) by mouth daily.     multivitamin with minerals tablet  Take 1 tablet by mouth daily.     nitroGLYCERIN 0.4 MG SL tablet  Commonly known as:  NITROSTAT  Place 0.4 mg under the tongue every 5 (five) minutes as needed for chest pain.     omeprazole 20 MG capsule  Commonly known as:  PRILOSEC  Take 20 mg by mouth daily.     ONGLYZA 5 MG Tabs tablet   Generic drug:  saxagliptin HCl  Take 5 mg by mouth daily.     pravastatin 20 MG tablet  Commonly known as:  PRAVACHOL  Take 20 mg by mouth 3 (three) times a week.     vitamin B-12 1000 MCG tablet  Commonly known as:  CYANOCOBALAMIN  Take 1,000 mcg by mouth daily.            Follow-up Information    Follow up with Gilford Rile, MD.   Specialty:  Internal Medicine   Contact information:   [address] [place] [address] [phone]       Call Faye Ramsay, MD.   Specialty:  Internal Medicine   Why:  As needed call my cell phone [phone]   Contact information:   [address] [place] [address] [phone]        The results of significant diagnostics from this hospitalization (including imaging, microbiology, ancillary and laboratory) are listed below for reference.     Microbiology: No results found for this or any previous visit (from the past 240 hour(s)).   Labs: Basic Metabolic Panel:  Recent Labs Lab 08/28/15 2037 08/29/15 0530 08/30/15 0430 08/31/15 0859 09/01/15 0522 09/02/15 0526  NA  --  132* 136 136 137 137  K  --  4.2 4.3 4.5 4.1 4.3  CL  --  99* 101 100* 104 104  CO2  --  23 25 25 22 22   GLUCOSE  --  347* 178* 298* 242* 173*  BUN  --  25* 23* 22* 21* 18  CREATININE  --  1.49* 1.56* 1.68* 1.43* 1.46*  CALCIUM  --  8.9 9.2 9.2 8.4* 9.0  MG 1.5*  --   --   --   --   --   PHOS 3.6  --   --   --   --   --    Liver Function Tests:  Recent Labs Lab 08/29/15 0530  AST 31  ALT 26  ALKPHOS 77  BILITOT 0.7  PROT 5.5*  ALBUMIN 3.0*   CBC:  Recent Labs Lab 08/29/15 0530 08/30/15 0430 09/02/15 0526  WBC 7.4 7.6 9.2  NEUTROABS 4.4  --   --   HGB 12.0* 12.3* 12.1*  HCT 35.2* 36.1* 36.6*  MCV 91.9 92.1 93.1  PLT 173 163 159   Cardiac Enzymes:  Recent Labs Lab 08/28/15 2037 08/29/15 0530  TROPONINI 0.03 0.03   CBG:  Recent Labs Lab 09/01/15 0636 09/01/15 1138 09/01/15 1641  09/01/15 2122 09/02/15 0636  GLUCAP 214* 257* 271* 120* 142*   SIGNED: Time coordinating discharge:  30 minutes  MAGICK-Annarae Macnair, MD  [facility] 09/02/2015, 10:03 AM Pager [phone]  If 7PM-7AM, please contact night-coverage [email or web address] Password [email or web address]

## 2015-09-09 ENCOUNTER — Other Ambulatory Visit (HOSPITAL_COMMUNITY): Payer: Self-pay | Admitting: Interventional Radiology

## 2015-09-09 DIAGNOSIS — I771 Stricture of artery: Secondary | ICD-10-CM

## 2015-09-13 ENCOUNTER — Other Ambulatory Visit: Payer: Self-pay | Admitting: Radiology

## 2015-09-14 ENCOUNTER — Encounter (HOSPITAL_COMMUNITY): Payer: Self-pay | Admitting: *Deleted

## 2015-09-15 ENCOUNTER — Ambulatory Visit (HOSPITAL_COMMUNITY)
Admission: RE | Admit: 2015-09-15 | Discharge: 2015-09-15 | Disposition: A | Discharge status: Home / Self Care | Payer: Commercial Indemnity | Source: Ambulatory Visit | Attending: Interventional Radiology | Admitting: Interventional Radiology

## 2015-09-15 ENCOUNTER — Encounter (HOSPITAL_COMMUNITY): Payer: Self-pay

## 2015-09-15 ENCOUNTER — Encounter (HOSPITAL_COMMUNITY): Payer: Self-pay | Admitting: *Deleted

## 2015-09-15 ENCOUNTER — Encounter (HOSPITAL_COMMUNITY)
Admission: RE | Disposition: A | Discharge status: Home / Self Care | Payer: Self-pay | Source: Ambulatory Visit | Attending: Interventional Radiology

## 2015-09-15 ENCOUNTER — Inpatient Hospital Stay (HOSPITAL_COMMUNITY): Payer: Medicare Other | Admitting: Certified Registered"

## 2015-09-15 ENCOUNTER — Ambulatory Visit (HOSPITAL_COMMUNITY)
Admission: RE | Admit: 2015-09-15 | Discharge: 2015-09-15 | Disposition: A | Discharge status: Home / Self Care | Payer: Medicare Other | Source: Ambulatory Visit | Attending: Interventional Radiology | Admitting: Interventional Radiology

## 2015-09-15 ENCOUNTER — Encounter (HOSPITAL_COMMUNITY): Payer: Self-pay | Admitting: Anesthesiology

## 2015-09-15 DIAGNOSIS — I6502 Occlusion and stenosis of left vertebral artery: Secondary | ICD-10-CM | POA: Insufficient documentation

## 2015-09-15 DIAGNOSIS — N289 Disorder of kidney and ureter, unspecified: Secondary | ICD-10-CM | POA: Insufficient documentation

## 2015-09-15 DIAGNOSIS — I252 Old myocardial infarction: Secondary | ICD-10-CM | POA: Diagnosis not present

## 2015-09-15 DIAGNOSIS — I10 Essential (primary) hypertension: Secondary | ICD-10-CM | POA: Diagnosis not present

## 2015-09-15 DIAGNOSIS — Z87891 Personal history of nicotine dependence: Secondary | ICD-10-CM | POA: Diagnosis not present

## 2015-09-15 DIAGNOSIS — K219 Gastro-esophageal reflux disease without esophagitis: Secondary | ICD-10-CM | POA: Insufficient documentation

## 2015-09-15 DIAGNOSIS — Z951 Presence of aortocoronary bypass graft: Secondary | ICD-10-CM | POA: Diagnosis not present

## 2015-09-15 DIAGNOSIS — I739 Peripheral vascular disease, unspecified: Secondary | ICD-10-CM | POA: Diagnosis not present

## 2015-09-15 DIAGNOSIS — Z5309 Procedure and treatment not carried out because of other contraindication: Secondary | ICD-10-CM | POA: Diagnosis not present

## 2015-09-15 DIAGNOSIS — E119 Type 2 diabetes mellitus without complications: Secondary | ICD-10-CM | POA: Insufficient documentation

## 2015-09-15 DIAGNOSIS — E039 Hypothyroidism, unspecified: Secondary | ICD-10-CM | POA: Insufficient documentation

## 2015-09-15 HISTORY — DX: Reserved for inherently not codable concepts without codable children: IMO0001

## 2015-09-15 HISTORY — DX: Hypothyroidism, unspecified: E03.9

## 2015-09-15 HISTORY — DX: Syncope and collapse: R55

## 2015-09-15 HISTORY — DX: Cerebral infarction, unspecified: I63.9

## 2015-09-15 HISTORY — DX: Malignant (primary) neoplasm, unspecified: C80.1

## 2015-09-15 HISTORY — DX: Gout, unspecified: M10.9

## 2015-09-15 HISTORY — DX: Hyperlipidemia, unspecified: E78.5

## 2015-09-15 HISTORY — PX: RADIOLOGY WITH ANESTHESIA: SHX6223

## 2015-09-15 HISTORY — DX: Unspecified dementia, unspecified severity, without behavioral disturbance, psychotic disturbance, mood disturbance, and anxiety: F03.90

## 2015-09-15 HISTORY — DX: Gastro-esophageal reflux disease without esophagitis: K21.9

## 2015-09-15 LAB — COMPREHENSIVE METABOLIC PANEL
ALT: 25 U/L (ref 17–63)
ANION GAP: 9 (ref 5–15)
AST: 30 U/L (ref 15–41)
Albumin: 3.2 g/dL — ABNORMAL LOW (ref 3.5–5.0)
Alkaline Phosphatase: 69 U/L (ref 38–126)
BUN: 31 mg/dL — ABNORMAL HIGH (ref 6–20)
CHLORIDE: 107 mmol/L (ref 101–111)
CO2: 20 mmol/L — AB (ref 22–32)
Calcium: 8.9 mg/dL (ref 8.9–10.3)
Creatinine, Ser: 1.77 mg/dL — ABNORMAL HIGH (ref 0.61–1.24)
GFR, EST AFRICAN AMERICAN: 40 mL/min — AB (ref >60)
GFR, EST NON AFRICAN AMERICAN: 35 mL/min — AB (ref >60)
Glucose, Bld: 142 mg/dL — ABNORMAL HIGH (ref 65–99)
POTASSIUM: 4.5 mmol/L (ref 3.5–5.1)
SODIUM: 136 mmol/L (ref 135–145)
Total Bilirubin: 0.6 mg/dL (ref 0.3–1.2)
Total Protein: 5.8 g/dL — ABNORMAL LOW (ref 6.5–8.1)

## 2015-09-15 LAB — CBC WITH DIFFERENTIAL/PLATELET
Basophils Absolute: 0.1 10*3/uL (ref 0.0–0.1)
Basophils Relative: 1 %
EOS ABS: 0.4 10*3/uL (ref 0.0–0.7)
EOS PCT: 5 %
HCT: 34.6 % — ABNORMAL LOW (ref 39.0–52.0)
Hemoglobin: 11.4 g/dL — ABNORMAL LOW (ref 13.0–17.0)
LYMPHS ABS: 1.9 10*3/uL (ref 0.7–4.0)
LYMPHS PCT: 24 %
MCH: 30.6 pg (ref 26.0–34.0)
MCHC: 32.9 g/dL (ref 30.0–36.0)
MCV: 92.8 fL (ref 78.0–100.0)
MONO ABS: 0.6 10*3/uL (ref 0.1–1.0)
Monocytes Relative: 7 %
Neutro Abs: 4.8 10*3/uL (ref 1.7–7.7)
Neutrophils Relative %: 63 %
PLATELETS: 252 10*3/uL (ref 150–400)
RBC: 3.73 MIL/uL — ABNORMAL LOW (ref 4.22–5.81)
RDW: 12.7 % (ref 11.5–15.5)
WBC: 7.7 10*3/uL (ref 4.0–10.5)

## 2015-09-15 LAB — GLUCOSE, CAPILLARY: Glucose-Capillary: 142 mg/dL — ABNORMAL HIGH (ref 65–99)

## 2015-09-15 LAB — PLATELET INHIBITION P2Y12: PLATELET FUNCTION P2Y12: 291 [PRU] (ref 194–418)

## 2015-09-15 LAB — PROTIME-INR
INR: 1.18 (ref 0.00–1.49)
PROTHROMBIN TIME: 15.2 s (ref 11.6–15.2)

## 2015-09-15 LAB — APTT: APTT: 29 s (ref 24–37)

## 2015-09-15 SURGERY — RADIOLOGY WITH ANESTHESIA
Anesthesia: General

## 2015-09-15 MED ORDER — ASPIRIN 81 MG PO CHEW
CHEWABLE_TABLET | ORAL | Status: DC
Start: 2015-09-15 — End: 2015-09-15
  Filled 2015-09-15: qty 3

## 2015-09-15 MED ORDER — LACTATED RINGERS IV SOLN
INTRAVENOUS | Status: AC | PRN
  Administered 2015-09-15: 08:00:00 via INTRAVENOUS

## 2015-09-15 MED ORDER — ASPIRIN 81 MG PO CHEW
243.0000 mg | CHEWABLE_TABLET | Freq: Once | ORAL | Status: AC
  Administered 2015-09-15: 243 mg via ORAL
  Filled 2015-09-15: qty 3

## 2015-09-15 MED ORDER — SODIUM CHLORIDE 0.9 % IV SOLN: INTRAVENOUS | Status: DC

## 2015-09-15 NOTE — H&P (Signed)
Chief Complaint: Patient was seen in consultation today for cerebral arteriogram with probable angioplasty/stent Left vertebral artery at the request of Dr Audria Nine  Referring Physician(s): Dr Audria Nine  Supervising Physician: Luanne Bras  Patient Status: Out-pt  History of Present Illness: Johnny Murphy is a 82 y.o. male   CVA Recurrent syncope Passed out at restaurant 08/28/15 Admitted through ED MRI 4/9: IMPRESSION: 1. No acute intracranial abnormality. 2. Multiple remote lacunar infarcts of the basal ganglia and cerebellum bilaterally. 3. Remote small cortical infarcts of the right parietal lobe. 4. Abnormal signal within the cavernous right internal carotid artery and the right vertebral artery compatible with occlusions identified on the previous study. Flow is present within the right M1 segment.  Arteriogram 4/10: IMPRESSION: Approximately 75% narrowing of the dominant left vertebral artery at its origin.  Occluded right internal carotid artery just distal to the origin of the right posterior communicating artery.  Occluded right vertebral artery at the level of C1.  Severe stenosis of the left external carotid artery at its origin.  Approximately 30% stenosis of the left internal carotid artery at the bulb by the NASCET criteria without associated ulcerations.  Approximately 50% stenosis of the distal basilar artery, and to a lesser degree of the mid basilar artery.  Mild-to-moderate intracranial arteriosclerosis involving primarily the posterior circulation as described.  Now on ASA/Plavix Scheduled today for angioplasty/stent L Vertebral artery   Past Medical History  Diagnosis Date  . Diabetes mellitus   . Hypertension   . Myocardial infarction ([place])   . Arthritis   . ARF (acute renal failure) ([place])   . Stroke [facility])     ? 4/20107- multiple Lacunar  . Dementia     short term recently  . Syncope   . GERD  (gastroesophageal reflux disease)   . Gout   . Cancer [facility])     Skin cancer- face- Basal  . Carotid stenosis, asymptomatic 12/02/2010    Carotid Occulusion- right  . Hyperlipemia   . Shortness of breath dyspnea     at times  . Hypothyroidism     Past Surgical History  Procedure Laterality Date  . Trapease ivc filter  05/01/2002    conditional 5 up to 3T Mri   . Coronary artery bypass graft    . Knee arthroscopy Bilateral   . Coronary angioplasty    . Cardiac stents      x2 - ? 2007 or 2012-[place]  . Colonoscopy w/ polypectomy      Allergies: Statins  Medications: Prior to Admission medications   Medication Sig Start Date End Date Taking? Authorizing Provider  aspirin 81 MG tablet Take 81 mg by mouth daily.   Yes Historical Provider, MD  carvedilol (COREG) 6.25 MG tablet Take 1 tablet (6.25 mg total) by mouth 2 (two) times daily with a meal. 09/02/15  Yes Theodis Blaze, MD  cholecalciferol (VITAMIN D) 1000 units tablet Take 1,000 Units by mouth daily.   Yes Historical Provider, MD  clopidogrel (PLAVIX) 75 MG tablet Take 1 tablet (75 mg total) by mouth daily. 09/02/15  Yes Theodis Blaze, MD  colchicine 0.6 MG tablet Take 1 tablet by mouth daily as needed. 07/07/15  Yes Historical Provider, MD  GLIPIZIDE XL 10 MG 24 hr tablet Take 10 mg by mouth daily with breakfast.  09/08/10  Yes Historical Provider, MD  insulin glargine (LANTUS) 100 UNIT/ML injection Inject 10 Units into the skin at bedtime.   Yes Historical  Provider, MD  levothyroxine (SYNTHROID, LEVOTHROID) 25 MCG tablet Take 25 mcg by mouth daily before breakfast.   Yes Historical Provider, MD  lisinopril (PRINIVIL,ZESTRIL) 5 MG tablet Take 1 tablet (5 mg total) by mouth daily. 09/02/15  Yes Theodis Blaze, MD  Multiple Vitamins-Minerals (MULTIVITAMIN WITH MINERALS) tablet Take 1 tablet by mouth daily.   Yes Historical Provider, MD  nitroGLYCERIN (NITROSTAT) 0.4 MG SL tablet Place 0.4 mg under the tongue every 5 (five) minutes  as needed for chest pain.  08/10/15  Yes Historical Provider, MD  omeprazole (PRILOSEC) 20 MG capsule Take 20 mg by mouth daily.  09/08/10  Yes Historical Provider, MD  pravastatin (PRAVACHOL) 20 MG tablet Take 20 mg by mouth 3 (three) times a week.  11/04/10  Yes Historical Provider, MD  saxagliptin HCl (ONGLYZA) 5 MG TABS tablet Take 5 mg by mouth daily.   Yes Historical Provider, MD  vitamin B-12 (CYANOCOBALAMIN) 1000 MCG tablet Take 1,000 mcg by mouth daily.   Yes Historical Provider, MD     History reviewed. No pertinent family history.  Social History   Social History  . Marital Status: Married    Spouse Name: N/A  . Number of Children: N/A  . Years of Education: N/A   Social History Main Topics  . Smoking status: Former Smoker    Types: Cigarettes  . Smokeless tobacco: Former Systems developer    Quit date: 11/01/1977  . Alcohol Use: No  . Drug Use: No  . Sexual Activity: Not Asked   Other Topics Concern  . None   Social History Narrative     Review of Systems: A 12 point ROS discussed and pertinent positives are indicated in the HPI above.  All other systems are negative.  Review of Systems  Constitutional: Negative for fever, activity change, appetite change and fatigue.  HENT: Negative for tinnitus, trouble swallowing and voice change.   Eyes: Negative for visual disturbance.  Respiratory: Negative for shortness of breath.   Gastrointestinal: Negative for abdominal pain.  Musculoskeletal: Negative for gait problem.  Neurological: Positive for syncope and light-headedness. Negative for dizziness, tremors, seizures, facial asymmetry, weakness, numbness and headaches.  Psychiatric/Behavioral: Negative for behavioral problems and confusion.    Vital Signs: There were no vitals taken for this visit.  Physical Exam  Constitutional: He is oriented to person, place, and time. He appears well-nourished.  HENT:  Head: Atraumatic.  Eyes: EOM are normal.  Neck: Neck supple.    Cardiovascular: Normal rate, regular rhythm and normal heart sounds.   No murmur heard. Pulmonary/Chest: Effort normal and breath sounds normal. He has no wheezes.  Abdominal: Soft. Bowel sounds are normal. There is no tenderness.  Musculoskeletal: Normal range of motion.  Neurological: He is alert and oriented to person, place, and time.  Skin: Skin is warm and dry.  Psychiatric: He has a normal mood and affect. His behavior is normal. Judgment and thought content normal.  Nursing note and vitals reviewed.   Mallampati Score:  MD Evaluation Airway: WNL Heart: WNL Abdomen: WNL Chest/ Lungs: WNL ASA  Classification: 3 Mallampati/Airway Score: One  Imaging: Mr Brain Wo Contrast  08/29/2015  CLINICAL DATA:  Syncopal episode yesterday. EXAM: MRI HEAD WITHOUT CONTRAST TECHNIQUE: Multiplanar, multiecho pulse sequences of the brain and surrounding structures were obtained without intravenous contrast. COMPARISON:  CTA of the head and neck 08/28/2015. FINDINGS: The diffusion-weighted images demonstrate no evidence for acute or subacute infarction. Acute hemorrhage or mass lesion is present. Remote lacunar infarcts are  present within the basal ganglia and cerebellum bilaterally Moderate periventricular white matter changes are present bilaterally. Focal remote nonhemorrhagic cortical infarcts are present in the right parietal lobe. The internal auditory canals are within normal limits bilaterally. White matter changes extend into the brainstem. A remote lacunar infarct is present in the left thalamus. Moderate generalized atrophy is present. No significant extra-axial fluid collection is present. Abnormal signal in the right vertebral artery below the dural margin and within the cavernous right internal carotid artery compatible with occlusion is identified on the previous study. Flow is otherwise present in the major intracranial arteries. The skullbase is within normal limits. Midline sagittal  images are unremarkable. IMPRESSION: 1. No acute intracranial abnormality. 2. Multiple remote lacunar infarcts of the basal ganglia and cerebellum bilaterally. 3. Remote small cortical infarcts of the right parietal lobe. 4. Abnormal signal within the cavernous right internal carotid artery and the right vertebral artery compatible with occlusions identified on the previous study. Flow is present within the right M1 segment. Electronically Signed   By: San Morelle M.D.   On: 08/29/2015 10:31   US Renal  08/31/2015  CLINICAL DATA:  Acute onset of renal failure.  Initial encounter. EXAM: RENAL / URINARY TRACT ULTRASOUND COMPLETE COMPARISON:  None. FINDINGS: Right Kidney: Length: 12.0 cm. Echogenicity within normal limits. No mass or hydronephrosis visualized. Left Kidney: Length: 9.1 cm. Echogenicity within normal limits. Mild cortical thinning is noted. No mass or hydronephrosis visualized. Bladder: Appears normal for degree of bladder distention. Scattered calcified granulomata are noted within the spleen. IMPRESSION: 1. No evidence of hydronephrosis. 2. Mild left renal cortical thinning may reflect chronic renal disease or underlying mild atrophy. Electronically Signed   By: Garald Balding M.D.   On: 08/31/2015 21:26   Ir Angio Intra Extracran Sel Com Carotid Innominate Bilat Mod Sed  08/30/2015  CLINICAL DATA:  Vertebrobasilar ischemia. Syncope. Previous history 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 carotid artery intracranially. EXAM: BILATERAL COMMON CAROTID AND INNOMINATE ANGIOGRAPHY AND BILATERAL VERTEBRAL ARTERY ANGIOGRAMS PROCEDURE: Contrast: 32mL ISOVUE-300 IOPAMIDOL (ISOVUE-300) INJECTION 61%, 37mL ISOVUE-300 IOPAMIDOL (ISOVUE-300) INJECTION 61% Anesthesia/Sedation:  Conscious sedation. Medications: Versed 1 mg IV. Fentanyl 25 mcg IV. Heparin 1000 units IV. Following a full explanation of the procedure along with the potential associated complications, an informed  witnessed consent was obtained. The right groin was prepped and draped in the usual sterile fashion. Thereafter using modified Seldinger technique, transfemoral access into the right common femoral artery was obtained without difficulty. Over a 0.035 inch guidewire, a 5 French Pinnacle sheath was inserted. Through this, and also over 0.035 inch guidewire, a 5 [place] JB 1 catheter was advanced to the aortic arch region and selectively positioned in the right common carotid artery, the right subclavian artery, the left common carotid artery and the left subclavian artery. There were no acute complications. The patient tolerated the procedure well. FINDINGS: The right common carotid arteriogram demonstrates the right external carotid artery and its major branches to be widely patent. The right internal carotid artery at the bulb and just distal to the bulb demonstrates a long segment irregular atherosclerotic plaque with a shallow ulcer proximally. There is associated approximately 50% stenosis by the NASCET criteria. There is moderate tortuosity of the mid cervical right ICA. More distally patency of the petrous and the cavernous segments is noted. There is complete occlusion of the right internal carotid artery just distal to the origin of the right posterior communicating artery. The delayed arterial phase demonstrates attempt  at leptomeningeal collateralization from the P3 segments of the right posterior cerebral artery supplying the posterior parietal right MCA distribution. The right subclavian arteriogram demonstrates complete occlusion of a hypoplastic right vertebral artery at the level of C1. The left common carotid arteriogram demonstrates the left external carotid artery to be severely narrowed by approximately 75%. Its branches however are seen to opacify adequately. Also demonstrated is a shelf-like plaque of the posterolateral aspect of the left internal carotid artery resulting in approximately 30%  stenosis by NASCET criteria. There is a U shaped tortuosity of the mid cervical left ICA without evidence of kinking. More distally, the vessel is seen to opacify normally to the cranial skull base. Normal opacification is seen of the petrous, cavernous and the supraclinoid segments. Left posterior communicating artery is seen opacifying the left posterior cerebral artery distribution. The left middle cerebral artery and the left anterior cerebral artery are seen to opacify into the capillary and the venous phases. Prompt simultaneous opacification via the anterior communicating artery of the right anterior cerebral artery A1-A2 segments and distally, and the right middle cerebral artery distribution is noted. The is a moderate 50% narrowing of the left anterior cerebral artery A1 segment. The left subclavian arteriogram demonstrates approximately 75% percent stenosis of the dominant left vertebral artery at its origin. This is associated with a small plaque just proximal to its origin. Mild-to-moderate irregularity of the subclavian artery proximally is related to atherosclerotic disease. There is mild tortuosity of the left vertebral artery proximally. More distally the vessel is seen to opacify to the cranial skull base. Opacification of the left posterior inferior cerebellar artery is noted. Mild narrowing of the left vertebrobasilar junction just proximal to the basilar artery is noted as well as the mid basilar artery. A tapered narrowing of the distal basilar artery of about 50% is noted as well. The left posterior cerebral artery and the right posterior cerebral artery demonstrate focal areas of caliber irregularity suggestive of intracranial arteriosclerosis. Retrograde opacification of the hypoplastic right vertebrobasilar junction to the right posterior-inferior cerebellar artery is also noted. IMPRESSION: Approximately 75% narrowing of the dominant left vertebral artery at its origin. Occluded right  internal carotid artery just distal to the origin of the right posterior communicating artery. Occluded right vertebral artery at the level of C1. Severe stenosis of the left external carotid artery at its origin. Approximately 30% stenosis of the left internal carotid artery at the bulb by the NASCET criteria without associated ulcerations. Approximately 50% stenosis of the distal basilar artery, and to a lesser degree of the mid basilar artery. Mild-to-moderate intracranial arteriosclerosis involving primarily the posterior circulation as described. Electronically Signed   By: Luanne Bras M.D.   On: 08/30/2015 10:27   Ir Angio Vertebral Sel Subclavian Innominate Bilat Mod Sed  08/30/2015  CLINICAL DATA:  Vertebrobasilar ischemia. Syncope. Previous history 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 carotid artery intracranially. EXAM: BILATERAL COMMON CAROTID AND INNOMINATE ANGIOGRAPHY AND BILATERAL VERTEBRAL ARTERY ANGIOGRAMS PROCEDURE: Contrast: 21mL ISOVUE-300 IOPAMIDOL (ISOVUE-300) INJECTION 61%, 54mL ISOVUE-300 IOPAMIDOL (ISOVUE-300) INJECTION 61% Anesthesia/Sedation:  Conscious sedation. Medications: Versed 1 mg IV. Fentanyl 25 mcg IV. Heparin 1000 units IV. Following a full explanation of the procedure along with the potential associated complications, an informed witnessed consent was obtained. The right groin was prepped and draped in the usual sterile fashion. Thereafter using modified Seldinger technique, transfemoral access into the right common femoral artery was obtained without difficulty. Over a 0.035 inch guidewire, a 5 [place]  Pinnacle sheath was inserted. Through this, and also over 0.035 inch guidewire, a 5 [place] JB 1 catheter was advanced to the aortic arch region and selectively positioned in the right common carotid artery, the right subclavian artery, the left common carotid artery and the left subclavian artery. There were no acute complications. The patient  tolerated the procedure well. FINDINGS: The right common carotid arteriogram demonstrates the right external carotid artery and its major branches to be widely patent. The right internal carotid artery at the bulb and just distal to the bulb demonstrates a long segment irregular atherosclerotic plaque with a shallow ulcer proximally. There is associated approximately 50% stenosis by the NASCET criteria. There is moderate tortuosity of the mid cervical right ICA. More distally patency of the petrous and the cavernous segments is noted. There is complete occlusion of the right internal carotid artery just distal to the origin of the right posterior communicating artery. The delayed arterial phase demonstrates attempt at leptomeningeal collateralization from the P3 segments of the right posterior cerebral artery supplying the posterior parietal right MCA distribution. The right subclavian arteriogram demonstrates complete occlusion of a hypoplastic right vertebral artery at the level of C1. The left common carotid arteriogram demonstrates the left external carotid artery to be severely narrowed by approximately 75%. Its branches however are seen to opacify adequately. Also demonstrated is a shelf-like plaque of the posterolateral aspect of the left internal carotid artery resulting in approximately 30% stenosis by NASCET criteria. There is a U shaped tortuosity of the mid cervical left ICA without evidence of kinking. More distally, the vessel is seen to opacify normally to the cranial skull base. Normal opacification is seen of the petrous, cavernous and the supraclinoid segments. Left posterior communicating artery is seen opacifying the left posterior cerebral artery distribution. The left middle cerebral artery and the left anterior cerebral artery are seen to opacify into the capillary and the venous phases. Prompt simultaneous opacification via the anterior communicating artery of the right anterior cerebral  artery A1-A2 segments and distally, and the right middle cerebral artery distribution is noted. The is a moderate 50% narrowing of the left anterior cerebral artery A1 segment. The left subclavian arteriogram demonstrates approximately 75% percent stenosis of the dominant left vertebral artery at its origin. This is associated with a small plaque just proximal to its origin. Mild-to-moderate irregularity of the subclavian artery proximally is related to atherosclerotic disease. There is mild tortuosity of the left vertebral artery proximally. More distally the vessel is seen to opacify to the cranial skull base. Opacification of the left posterior inferior cerebellar artery is noted. Mild narrowing of the left vertebrobasilar junction just proximal to the basilar artery is noted as well as the mid basilar artery. A tapered narrowing of the distal basilar artery of about 50% is noted as well. The left posterior cerebral artery and the right posterior cerebral artery demonstrate focal areas of caliber irregularity suggestive of intracranial arteriosclerosis. Retrograde opacification of the hypoplastic right vertebrobasilar junction to the right posterior-inferior cerebellar artery is also noted. IMPRESSION: Approximately 75% narrowing of the dominant left vertebral artery at its origin. Occluded right internal carotid artery just distal to the origin of the right posterior communicating artery. Occluded right vertebral artery at the level of C1. Severe stenosis of the left external carotid artery at its origin. Approximately 30% stenosis of the left internal carotid artery at the bulb by the NASCET criteria without associated ulcerations. Approximately 50% stenosis of the distal basilar artery, and to  a lesser degree of the mid basilar artery. Mild-to-moderate intracranial arteriosclerosis involving primarily the posterior circulation as described. Electronically Signed   By: Luanne Bras M.D.   On: 08/30/2015  10:27    Labs:  CBC:  Recent Labs  08/29/15 0530 08/30/15 0430 09/02/15 0526  WBC 7.4 7.6 9.2  HGB 12.0* 12.3* 12.1*  HCT 35.2* 36.1* 36.6*  PLT 173 163 159    COAGS:  Recent Labs  08/30/15 0731  INR 1.05  APTT 27    BMP:  Recent Labs  08/30/15 0430 08/31/15 0859 09/01/15 0522 09/02/15 0526  NA 136 136 137 137  K 4.3 4.5 4.1 4.3  CL 101 100* 104 104  CO2 25 25 22 22   GLUCOSE 178* 298* 242* 173*  BUN 23* 22* 21* 18  CALCIUM 9.2 9.2 8.4* 9.0  CREATININE 1.56* 1.68* 1.43* 1.46*  GFRNONAA 40* 37* 45* 44*  GFRAA 47* 43* 52* 51*    LIVER FUNCTION TESTS:  Recent Labs  08/29/15 0530  BILITOT 0.7  AST 31  ALT 26  ALKPHOS 77  PROT 5.5*  ALBUMIN 3.0*    TUMOR MARKERS: No results for input(s): AFPTM, CEA, CA199, CHROMGRNA in the last 8760 hours.  Assessment and Plan:  Syncope CVA Cerebral arteriogram 4/10 revealing L VA stenosis Now scheduled for L VA pta/stent Risks and Benefits discussed with the patient including, but not limited to bleeding, infection, vascular injury, contrast induced renal failure, stroke or even death. All of the patient's questions were answered, patient is agreeable to proceed. Consent signed and in chart.  Pt aware if intervention is performed, he will be admitted to Neuro ICU overnight and plan to discharge in am Agreeable  Thank you for this interesting consult.  I greatly enjoyed meeting MAXTON COVILL and look forward to participating in their care.  A copy of this report was sent to the requesting provider on this date.  Electronically Signed: Monia Sabal A 09/15/2015, 7:45 AM   I spent a total of  30 Minutes   in face to face in clinical consultation, greater than 50% of which was counseling/coordinating care for L VA pta/stent

## 2015-09-15 NOTE — Progress Notes (Signed)
p2y12 Level is high.  Dr. Estanislado Pandy & Ferne Coe, PA made aware.  Decision made to cancel case for today.

## 2015-09-15 NOTE — Anesthesia Preprocedure Evaluation (Addendum)
Anesthesia Evaluation  Patient identified by MRN, date of birth, ID band Patient awake  General Assessment Comment:Past Medical History Diagnosis Date . Diabetes mellitus  . Hypertension  . Myocardial infarction ([place])  . Arthritis  . Carotid stenosis, asymptomatic 12/02/2010       Reviewed: Allergy & Precautions, NPO status , Patient's Chart, lab work & pertinent test results  Airway Mallampati: II  TM Distance: >3 FB Neck ROM: Full    Dental no notable dental hx. (+) Edentulous Upper, Edentulous Lower, Dental Advisory Given   Pulmonary neg pulmonary ROS, shortness of breath, former smoker,    Pulmonary exam normal breath sounds clear to auscultation       Cardiovascular hypertension, Pt. on medications and Pt. on home beta blockers + Past MI, + CABG and + Peripheral Vascular Disease  Normal cardiovascular exam Rhythm:Regular Rate:Normal  ECHO 09-02-15: Study Conclusions  - Left ventricle: Systolic function was severely reduced. The  estimated ejection fraction was in the range of 20% to 25%.  Impressions:  - This is a limited study ordered to further evaluate the  possibility of an apical thrombus  the LV function is markedly reduced. EF 25%.  There is no evidence of apical thrombus.   Neuro/Psych PSYCHIATRIC DISORDERS TIAnegative neurological ROS  negative psych ROS   GI/Hepatic Neg liver ROS, GERD  Medicated,  Endo/Other  diabetes, Type 2, Insulin DependentHypothyroidism   Renal/GU Renal InsufficiencyRenal diseaseCr 1.46 K 4.3  negative genitourinary   Musculoskeletal negative musculoskeletal ROS (+) Arthritis ,   Abdominal   Peds negative pediatric ROS (+)  Hematology negative hematology ROS (+)   Anesthesia Other Findings   Reproductive/Obstetrics negative OB ROS                          Anesthesia Physical Anesthesia Plan  ASA: III  Anesthesia  Plan: General   Post-op Pain Management:    Induction: Intravenous  Airway Management Planned: Oral ETT  Additional Equipment:   Intra-op Plan:   Post-operative Plan: Extubation in OR  Informed Consent: I have reviewed the patients History and Physical, chart, labs and discussed the procedure including the risks, benefits and alternatives for the proposed anesthesia with the patient or authorized representative who has indicated his/her understanding and acceptance.   Dental advisory given  Plan Discussed with: CRNA  Anesthesia Plan Comments:         Anesthesia Quick Evaluation

## 2015-09-15 NOTE — Progress Notes (Signed)
Patient ID: Johnny Murphy, male   DOB: 1933/04/23, 82 y.o.   MRN: [id number]   P2y12 291 today  Change to [place] and p2y12 Mon 09/20/15  Dr Estanislado Pandy spoke to family and pt Pt and family aware and agreeable   Will be rescheduled for later date

## 2015-09-16 ENCOUNTER — Encounter (HOSPITAL_COMMUNITY): Payer: Self-pay | Admitting: Interventional Radiology

## 2015-09-20 ENCOUNTER — Other Ambulatory Visit: Payer: Self-pay | Admitting: General Surgery

## 2015-09-20 LAB — PLATELET INHIBITION P2Y12: PLATELET FUNCTION P2Y12: 47 [PRU] — AB (ref 194–418)

## 2015-10-04 ENCOUNTER — Other Ambulatory Visit: Payer: Self-pay | Admitting: General Surgery

## 2015-10-05 ENCOUNTER — Other Ambulatory Visit: Payer: Self-pay | Admitting: Radiology

## 2015-10-05 ENCOUNTER — Other Ambulatory Visit: Payer: Self-pay | Admitting: General Surgery

## 2015-10-05 MED ORDER — CLOPIDOGREL BISULFATE 75 MG PO TABS: 75.0000 mg | ORAL_TABLET | ORAL | Status: DC

## 2015-10-06 ENCOUNTER — Ambulatory Visit (HOSPITAL_COMMUNITY)
Admission: RE | Admit: 2015-10-06 | Discharge: 2015-10-06 | Disposition: A | Discharge status: Home / Self Care | Payer: Medicare Other | Source: Ambulatory Visit | Attending: Interventional Radiology | Admitting: Interventional Radiology

## 2015-10-06 ENCOUNTER — Encounter (HOSPITAL_COMMUNITY): Payer: Self-pay | Admitting: *Deleted

## 2015-10-06 ENCOUNTER — Other Ambulatory Visit: Payer: Self-pay | Admitting: Radiology

## 2015-10-06 MED ORDER — CEFAZOLIN SODIUM-DEXTROSE 2-4 GM/100ML-% IV SOLN
2.0000 g | INTRAVENOUS | Status: AC
  Administered 2015-10-07: 2 g via INTRAVENOUS
  Filled 2015-10-06: qty 100

## 2015-10-06 NOTE — Progress Notes (Addendum)
Anesthesia Chart Review: SAME DAY WORK-UP.  Patient is an 82 year old male posted for angioplasty and possible stent on 10/07/15 by Dr. Estanislado Pandy. Procedure was scheduled for 09/15/15 but p2y12 was 291, so it was cancelled, and he was switched to [place]. (Chart was not forwarded for anesthesia review at that time, although it appears anesthesia Dr. Delma Post reviewed his history prior to case being cancelled due to elevated p2y12.) Of note, case was rescheduled initially for 10/06/15 but was inadvertently not scheduled with anesthesia so it was postponed until 10/07/15. Patient and his wife are very frustrated with all of the back and forth since patient's April admission.   He was hospitalized 08/28/15-09/02/15 with syncope/TIA and found to have bilateral vertebral artery stenosis (70-75% on the left and completely occluded on the right) and an occluded right internal carotid artery. MRI showed multiple remote lacunar infarcts and small cortical infarct of the right parietal lobe. TTE showed severe LV dysfunction with EF 25%. He has known CAD/MI s/p CABG (~ 1998) and PCI of LMA and ostial LCX '08, CKD stage III, HLD, PVD s/p right SFA atherectomy 06/04/14, hypothyroidism, HTN, DM2, RLS, short term memory loss, GERD, skin cancer, TrapEase IVC filter 05/01/02. His primary cardiologist is Dr. Bettina Gavia in [place], but Dr. Debara Pickett was consulted during his recent admission. Apical thrombus ruled out as a source of his syncope/TIA.  Possible etiology included cerebral hypoperfusion and neurology and IR recommended vertebral artery stenting. He did however have a first degree AVB and on telemetry had one episode of 2nd degree AVB, Type 1 and a 1.8 second pause. Arrhythmia and orthostatic hypotension were also possible causes. Consideration for loop recorder and ischemic work-up recommended as out patient through his primary cardiologist.   PCP is Dr. Gilford Rile with [facility] (See Care Everywhere), last visit  09/07/15.  Cardiologist is Dr. Shirlee More with [facility] in [place] (see Care Everywhere). He last saw patient on 09/14/15 the day before he was originally supposed to undergo this procedure. At that time his note states,  "Vertebral artery stenosis  Cardiomyopathy, secondary (RAF-HCC), clinically suspect is due to coronary ischemia and soon after he recovers from his interventional surgical vascular procedures undergo coronary arteriography  Coronary artery disease of native heart with stable angina pectoris, unspecified vessel or lesion type (RAF-HCC), plan as above for this point in time continue dual antiplatelet beta blocker and statin  Syncope, he is at risk for sudden death, VT/VF and needs coronary angiography soon after his PCI in 1-2 weeks if renal function is stable. He likely needs an ICD with or without further revascularization"  It looks like she was scheduled for routine follow-up on 09/29/15, but patient's wife called to notify his office that the procedure was rescheduled and would plan to see Dr. Bettina Gavia following IR procedure.   Meds include ASA, Coreg, Brilinta, Onglyza, pravastatin, Nitro, lisinopril, levothyroxine, Lantus, glipizide.   09/02/15 Limited Echo: Study Conclusions - Left ventricle: Systolic function was severely reduced. The  estimated ejection fraction was in the range of 20% to 25%. Impressions: - This is a limited study ordered to further evaluate the  possibility of an apical thrombus. There is no evidence of apical thrombus.  The LV function is markedly reduced. EF 25%.   08/31/15 Echo: Impressions: - LVEF is 25-30%, mildly dilated LV with normal wall thickness and  severe global hypokinesis with regional variation, mild MR,  moderate LAE, mild RVE with reduced RV systolic function, mild  TR, RVSP  30 mmHg, dilated IVC with estimated RA pressure of 8  mmHg. There is a possible apical LV thrombus noted on some of the  4 chamber  views - this appears to measure up to 2.0 cm. A &quot;bang&quot; artifact cannot be excluded. Recommend a limited contrast echo study.  08/28/15 EKG [facility] scanned under Media tab): Normal sinus rhythm at a rate of 63 bpm, first-degree AV block, LAD, pulmonary disease pattern, ST/T-wave abnormality inferolateral leadsin inferior lateral leads, consider ischemia. First degree AVB and inferolateral T wave abnormality was present on 06/09/15 tracing also scanned under the Media tab.  08/31/15 Renal U/S: IMPRESSION: 1. No evidence of hydronephrosis. 2. Mild left renal cortical thinning may reflect chronic renal disease or underlying mild atrophy.  08/30/15 Bilateral CCA and innominate angiography/vertebral angiogram: IMPRESSION: - Approximately 75% narrowing of the dominant left vertebral artery at its origin. - Occluded right internal carotid artery just distal to the origin of the right posterior communicating artery. - Occluded right vertebral artery at the level of C1. - Severe stenosis of the left external carotid artery at its origin. - Approximately 30% stenosis of the left internal carotid artery at the bulb by the NASCET criteria without associated ulcerations. - Approximately 50% stenosis of the distal basilar artery, and to a lesser degree of the mid basilar artery. - Mild-to-moderate intracranial arteriosclerosis involving primarily the posterior circulation as described.  He needs updated labs on arrival. Cr trends from 08/29/15-09/15/15 were 1.49-1.77 and was 1.55 on 07/21/15 (Care Everywhere). His glucose on 09/15/15 was 142; however, his A1c on 08/29/15 was 11.6 consistent with a mean plasma glucose of 286. (A1c was 10.9 on 05/06/15 in [place].) He does not check his home glucose.   I discussed above history with anesthesiologist Dr. Linna Caprice. IR procedure recommended for cerebral hypoperfusion felt to be contributing to syncope. He also needs ischemic work-up and possibly  ICD (if EF not improved), but recent cardiology visit with Dr. Bettina Gavia indicates that this is planned after patient recovers from his IR vascular procedure. Patient is a same day work-up, so further evaluation on the day of surgery by Dr. Estanislado Pandy and his anesthesiologist to ensure labs are acceptable and that he is not have any acute issues (ie, CHF, chest pain). A-line is planned (Per Anderson Malta in IR: DR. Estanislado Pandy DOES NOT WANT THE ALINE PLACED UNTIL LABS ARE DETERMINED ACCEPTABLE. He has given patient permission to arrive at 7:30 AM, as he does not think he will be ready to proceed until 9:30AM).   [facility] Short Stay Center/Anesthesiology Phone [phone] 10/06/2015 2:38 PM

## 2015-10-06 NOTE — Anesthesia Preprocedure Evaluation (Addendum)
Anesthesia Evaluation  Patient identified by MRN, date of birth, ID band Patient awake    Reviewed: Allergy & Precautions, NPO status , Patient's Chart, lab work & pertinent test results, reviewed documented beta blocker date and time   History of Anesthesia Complications Negative for: history of anesthetic complications  Airway Mallampati: II  TM Distance: >3 FB Neck ROM: Full    Dental  (+) Poor Dentition, Dental Advisory Given   Pulmonary shortness of breath, former smoker,    Pulmonary exam normal breath sounds clear to auscultation       Cardiovascular hypertension, Pt. on medications and Pt. on home beta blockers + Past MI, + Cardiac Stents, + CABG and + Peripheral Vascular Disease   Rhythm:Regular Rate:Normal  Echo 08/2015 with EF 20-25%   Neuro/Psych PSYCHIATRIC DISORDERS TIACVA    GI/Hepatic GERD  Medicated and Controlled,  Endo/Other  diabetes, Type 2Hypothyroidism   Renal/GU CRFRenal disease     Musculoskeletal  (+) Arthritis ,   Abdominal   Peds  Hematology   Anesthesia Other Findings Day of surgery labs acceptable:  P2Y12  39 K 4.5 Cr 1.99 Hgb 11  Reproductive/Obstetrics                      Anesthesia Physical Anesthesia Plan  ASA: III  Anesthesia Plan: MAC   Post-op Pain Management:    Induction: Intravenous  Airway Management Planned: Nasal Cannula  Additional Equipment:   Intra-op Plan:   Post-operative Plan: Possible Post-op intubation/ventilation  Informed Consent: I have reviewed the patients History and Physical, chart, labs and discussed the procedure including the risks, benefits and alternatives for the proposed anesthesia with the patient or authorized representative who has indicated his/her understanding and acceptance.   Dental advisory given  Plan Discussed with: CRNA and Anesthesiologist  Anesthesia Plan Comments: (PER IR: Patient to arrive at  7:30AM and no aline until labs are back (to ensure acceptable). Cancelled last month due to elevated p2y12. Extensive cardiac history outlined in my anesthesia note. Myra Gianotti, PA-C  After candid discussion with Dr. Estanislado Pandy and Mr. Ginley family given cardiac, PVD history and the requirments needed for the procedure we will proceed with light sedation using fentanyl and minimal versed and local anesthetic and groin site. Risks and benefits discussed with Mr. Crofoot and his wife Onalee Hua, they have no further questions or concerns. )    Anesthesia Quick Evaluation

## 2015-10-06 NOTE — Progress Notes (Signed)
Spoke with pt's wife, Onalee Hua for pre-op call. Pt has some dementia. Pt has hx of CAD with CABG 18 years ago and stent placement in 2008. Mrs. Gerken states he has not c/o any recent chest pain. Pt is diabetic, per his wife does not check his blood sugar on a routine basis and she is not aware of his fasting blood sugar. Last A1C was 11.6 on 08/29/15. Instructed Mrs. Scorsone to have pt take 1/2 of his regular dose of Lantus tonight (will be taking 5 units). She voiced understanding.  Mrs. Redfield is very upset with "the hospital". She was very frustrated that a letter had been sent to them telling them his surgery was scheduled for 10/06/15 (today) and found out late yesterday afternoon that he was not on the schedule for today. She is also upset about the last visit in April to have this procedure and it got cancelled (due to blood work) - she stated they waited and hour and a half after they arrived to get the blood drawn and then they had to wait, just to find out the surgery would be cancelled. I gave her the number to the Office of Patient Experience so she could voice her complaints. She states she already voiced them to [facility] in Dr. Arlean Hopping office.

## 2015-10-07 ENCOUNTER — Inpatient Hospital Stay (HOSPITAL_COMMUNITY)
Admission: RE | Admit: 2015-10-07 | Discharge: 2015-10-07 | DRG: 038 | Disposition: A | Discharge status: Home / Self Care | Payer: Medicare Other | Source: Ambulatory Visit | Attending: Interventional Radiology | Admitting: Interventional Radiology

## 2015-10-07 ENCOUNTER — Encounter (HOSPITAL_COMMUNITY): Payer: Self-pay | Admitting: Anesthesiology

## 2015-10-07 ENCOUNTER — Encounter (HOSPITAL_COMMUNITY)
Admission: RE | Disposition: A | Discharge status: Home / Self Care | Payer: Self-pay | Source: Ambulatory Visit | Attending: Interventional Radiology

## 2015-10-07 ENCOUNTER — Inpatient Hospital Stay (HOSPITAL_COMMUNITY)
Admission: RE | Admit: 2015-10-07 | Discharge: 2015-10-08 | Disposition: A | Discharge status: Home / Self Care | Payer: Medicare Other | Source: Ambulatory Visit | Attending: Interventional Radiology | Admitting: Interventional Radiology

## 2015-10-07 ENCOUNTER — Inpatient Hospital Stay (HOSPITAL_COMMUNITY): Payer: Medicare Other | Admitting: Vascular Surgery

## 2015-10-07 ENCOUNTER — Other Ambulatory Visit: Payer: Self-pay

## 2015-10-07 DIAGNOSIS — I480 Paroxysmal atrial fibrillation: Secondary | ICD-10-CM | POA: Diagnosis not present

## 2015-10-07 DIAGNOSIS — I429 Cardiomyopathy, unspecified: Secondary | ICD-10-CM | POA: Diagnosis present

## 2015-10-07 DIAGNOSIS — I252 Old myocardial infarction: Secondary | ICD-10-CM | POA: Diagnosis not present

## 2015-10-07 DIAGNOSIS — M109 Gout, unspecified: Secondary | ICD-10-CM | POA: Diagnosis present

## 2015-10-07 DIAGNOSIS — E785 Hyperlipidemia, unspecified: Secondary | ICD-10-CM | POA: Diagnosis present

## 2015-10-07 DIAGNOSIS — R001 Bradycardia, unspecified: Secondary | ICD-10-CM | POA: Diagnosis not present

## 2015-10-07 DIAGNOSIS — Z794 Long term (current) use of insulin: Secondary | ICD-10-CM | POA: Diagnosis not present

## 2015-10-07 DIAGNOSIS — I6509 Occlusion and stenosis of unspecified vertebral artery: Secondary | ICD-10-CM | POA: Diagnosis present

## 2015-10-07 DIAGNOSIS — Z7982 Long term (current) use of aspirin: Secondary | ICD-10-CM | POA: Diagnosis not present

## 2015-10-07 DIAGNOSIS — E118 Type 2 diabetes mellitus with unspecified complications: Secondary | ICD-10-CM | POA: Diagnosis present

## 2015-10-07 DIAGNOSIS — F0390 Unspecified dementia without behavioral disturbance: Secondary | ICD-10-CM | POA: Diagnosis present

## 2015-10-07 DIAGNOSIS — G450 Vertebro-basilar artery syndrome: Secondary | ICD-10-CM | POA: Diagnosis not present

## 2015-10-07 DIAGNOSIS — I6502 Occlusion and stenosis of left vertebral artery: Secondary | ICD-10-CM

## 2015-10-07 DIAGNOSIS — I2510 Atherosclerotic heart disease of native coronary artery without angina pectoris: Secondary | ICD-10-CM | POA: Diagnosis not present

## 2015-10-07 DIAGNOSIS — K219 Gastro-esophageal reflux disease without esophagitis: Secondary | ICD-10-CM | POA: Diagnosis present

## 2015-10-07 DIAGNOSIS — G2581 Restless legs syndrome: Secondary | ICD-10-CM | POA: Diagnosis present

## 2015-10-07 DIAGNOSIS — I10 Essential (primary) hypertension: Secondary | ICD-10-CM | POA: Diagnosis present

## 2015-10-07 DIAGNOSIS — I771 Stricture of artery: Secondary | ICD-10-CM

## 2015-10-07 DIAGNOSIS — Z7902 Long term (current) use of antithrombotics/antiplatelets: Secondary | ICD-10-CM

## 2015-10-07 HISTORY — DX: Restless legs syndrome: G25.81

## 2015-10-07 HISTORY — PX: RADIOLOGY WITH ANESTHESIA: SHX6223

## 2015-10-07 HISTORY — DX: Weakness: R53.1

## 2015-10-07 HISTORY — DX: Bronchitis, not specified as acute or chronic: J40

## 2015-10-07 LAB — BASIC METABOLIC PANEL
ANION GAP: 13 (ref 5–15)
BUN: 33 mg/dL — ABNORMAL HIGH (ref 6–20)
CHLORIDE: 106 mmol/L (ref 101–111)
CO2: 19 mmol/L — AB (ref 22–32)
CREATININE: 1.99 mg/dL — AB (ref 0.61–1.24)
Calcium: 8.8 mg/dL — ABNORMAL LOW (ref 8.9–10.3)
GFR calc non Af Amer: 30 mL/min — ABNORMAL LOW (ref >60)
GFR, EST AFRICAN AMERICAN: 35 mL/min — AB (ref >60)
Glucose, Bld: 122 mg/dL — ABNORMAL HIGH (ref 65–99)
Potassium: 4.5 mmol/L (ref 3.5–5.1)
Sodium: 138 mmol/L (ref 135–145)

## 2015-10-07 LAB — CBC WITH DIFFERENTIAL/PLATELET
Basophils Absolute: 0 10*3/uL (ref 0.0–0.1)
Basophils Relative: 1 %
Eosinophils Absolute: 0.2 10*3/uL (ref 0.0–0.7)
Eosinophils Relative: 3 %
HEMATOCRIT: 33.3 % — AB (ref 39.0–52.0)
HEMOGLOBIN: 11.2 g/dL — AB (ref 13.0–17.0)
LYMPHS ABS: 1.4 10*3/uL (ref 0.7–4.0)
LYMPHS PCT: 23 %
MCH: 31.6 pg (ref 26.0–34.0)
MCHC: 33.6 g/dL (ref 30.0–36.0)
MCV: 94.1 fL (ref 78.0–100.0)
MONOS PCT: 7 %
Monocytes Absolute: 0.5 10*3/uL (ref 0.1–1.0)
NEUTROS ABS: 4.1 10*3/uL (ref 1.7–7.7)
NEUTROS PCT: 66 %
Platelets: 154 10*3/uL (ref 150–400)
RBC: 3.54 MIL/uL — ABNORMAL LOW (ref 4.22–5.81)
RDW: 13.8 % (ref 11.5–15.5)
WBC: 6.2 10*3/uL (ref 4.0–10.5)

## 2015-10-07 LAB — GLUCOSE, CAPILLARY
GLUCOSE-CAPILLARY: 83 mg/dL (ref 65–99)
GLUCOSE-CAPILLARY: 90 mg/dL (ref 65–99)
GLUCOSE-CAPILLARY: 152 mg/dL — AB (ref 65–99)
Glucose-Capillary: 112 mg/dL — ABNORMAL HIGH (ref 65–99)

## 2015-10-07 LAB — POCT ACTIVATED CLOTTING TIME
Activated Clotting Time: 152 seconds
Activated Clotting Time: 157 seconds

## 2015-10-07 LAB — PLATELET INHIBITION P2Y12: Platelet Function  P2Y12: 39 [PRU] — ABNORMAL LOW (ref 194–418)

## 2015-10-07 LAB — APTT: aPTT: 30 seconds (ref 24–37)

## 2015-10-07 LAB — MRSA PCR SCREENING: MRSA by PCR: NEGATIVE

## 2015-10-07 LAB — HEPARIN LEVEL (UNFRACTIONATED): HEPARIN UNFRACTIONATED: 0.19 [IU]/mL — AB (ref 0.30–0.70)

## 2015-10-07 LAB — PROTIME-INR
INR: 1.32 (ref 0.00–1.49)
Prothrombin Time: 16.5 seconds — ABNORMAL HIGH (ref 11.6–15.2)

## 2015-10-07 SURGERY — RADIOLOGY WITH ANESTHESIA
Anesthesia: Monitor Anesthesia Care

## 2015-10-07 MED ORDER — TICAGRELOR 90 MG PO TABS
90.0000 mg | ORAL_TABLET | Freq: Once | ORAL | Status: DC
  Filled 2015-10-07: qty 1

## 2015-10-07 MED ORDER — IOPAMIDOL (ISOVUE-300) INJECTION 61%
INTRAVENOUS | Status: AC
Start: 2015-10-07 — End: 2015-10-07
  Administered 2015-10-07: 25 mL
  Filled 2015-10-07: qty 50

## 2015-10-07 MED ORDER — PANTOPRAZOLE SODIUM 40 MG PO TBEC
40.0000 mg | DELAYED_RELEASE_TABLET | Freq: Every day | ORAL | Status: DC
  Administered 2015-10-08: 40 mg via ORAL
  Filled 2015-10-07: qty 1

## 2015-10-07 MED ORDER — ONDANSETRON HCL 4 MG/2ML IJ SOLN: 4.0000 mg | Freq: Four times a day (QID) | INTRAMUSCULAR | Status: DC | PRN

## 2015-10-07 MED ORDER — ASPIRIN EC 325 MG PO TBEC
325.0000 mg | DELAYED_RELEASE_TABLET | ORAL | Status: AC
  Filled 2015-10-07: qty 1

## 2015-10-07 MED ORDER — ACETAMINOPHEN 500 MG PO TABS: 1000.0000 mg | ORAL_TABLET | Freq: Four times a day (QID) | ORAL | Status: DC | PRN

## 2015-10-07 MED ORDER — FENTANYL CITRATE (PF) 250 MCG/5ML IJ SOLN
INTRAMUSCULAR | Status: DC | PRN
  Administered 2015-10-07 (×2): 25 ug via INTRAVENOUS

## 2015-10-07 MED ORDER — NOREPINEPHRINE BITARTRATE 1 MG/ML IV SOLN
0.0000 ug/min | INTRAVENOUS | Status: DC
  Filled 2015-10-07: qty 4

## 2015-10-07 MED ORDER — LEVOTHYROXINE SODIUM 25 MCG PO TABS
25.0000 ug | ORAL_TABLET | Freq: Every day | ORAL | Status: DC
  Administered 2015-10-08: 25 ug via ORAL
  Filled 2015-10-07: qty 1

## 2015-10-07 MED ORDER — INSULIN ASPART 100 UNIT/ML ~~LOC~~ SOLN
0.0000 [IU] | Freq: Three times a day (TID) | SUBCUTANEOUS | Status: DC
  Administered 2015-10-08: 5 [IU] via SUBCUTANEOUS
  Administered 2015-10-08: 3 [IU] via SUBCUTANEOUS

## 2015-10-07 MED ORDER — LIDOCAINE HCL 1 % IJ SOLN
INTRAMUSCULAR | Status: AC
  Filled 2015-10-07: qty 20

## 2015-10-07 MED ORDER — ACETAMINOPHEN 650 MG RE SUPP: 650.0000 mg | Freq: Four times a day (QID) | RECTAL | Status: DC | PRN

## 2015-10-07 MED ORDER — LISINOPRIL 5 MG PO TABS
5.0000 mg | ORAL_TABLET | Freq: Every day | ORAL | Status: DC
  Administered 2015-10-07 – 2015-10-08 (×2): 5 mg via ORAL
  Filled 2015-10-07 (×2): qty 1

## 2015-10-07 MED ORDER — MENTHOL 3 MG MT LOZG
1.0000 | LOZENGE | OROMUCOSAL | Status: DC | PRN
  Filled 2015-10-07: qty 9

## 2015-10-07 MED ORDER — ASPIRIN 325 MG PO TABS: 325.0000 mg | ORAL_TABLET | Freq: Every day | ORAL | Status: DC

## 2015-10-07 MED ORDER — IOPAMIDOL (ISOVUE-300) INJECTION 61%
INTRAVENOUS | Status: AC
  Filled 2015-10-07: qty 150

## 2015-10-07 MED ORDER — HEPARIN (PORCINE) IN NACL 100-0.45 UNIT/ML-% IJ SOLN
INTRAMUSCULAR | Status: AC
  Filled 2015-10-07: qty 250

## 2015-10-07 MED ORDER — NIMODIPINE 30 MG PO CAPS
0.0000 mg | ORAL_CAPSULE | ORAL | Status: AC
Start: 2015-10-07 — End: 2015-10-07
  Filled 2015-10-07: qty 2

## 2015-10-07 MED ORDER — NITROGLYCERIN 0.4 MG SL SUBL: 0.4000 mg | SUBLINGUAL_TABLET | SUBLINGUAL | Status: DC | PRN

## 2015-10-07 MED ORDER — TICAGRELOR 90 MG PO TABS
90.0000 mg | ORAL_TABLET | Freq: Two times a day (BID) | ORAL | Status: DC
  Administered 2015-10-07 – 2015-10-08 (×2): 90 mg via ORAL
  Filled 2015-10-07 (×2): qty 1

## 2015-10-07 MED ORDER — NIMODIPINE 30 MG PO CAPS
0.0000 mg | ORAL_CAPSULE | ORAL | Status: DC
  Filled 2015-10-07: qty 2

## 2015-10-07 MED ORDER — IOPAMIDOL (ISOVUE-300) INJECTION 61%
INTRAVENOUS | Status: AC
Start: 2015-10-07 — End: 2015-10-07
  Administered 2015-10-07: 75 mL
  Filled 2015-10-07: qty 50

## 2015-10-07 MED ORDER — AMLODIPINE BESYLATE 5 MG PO TABS: 5.0000 mg | ORAL_TABLET | Freq: Every day | ORAL | Status: DC

## 2015-10-07 MED ORDER — CEFAZOLIN SODIUM-DEXTROSE 2-4 GM/100ML-% IV SOLN
2.0000 g | INTRAVENOUS | Status: DC
  Filled 2015-10-07: qty 100

## 2015-10-07 MED ORDER — ASPIRIN EC 325 MG PO TBEC
325.0000 mg | DELAYED_RELEASE_TABLET | ORAL | Status: DC
  Filled 2015-10-07: qty 1

## 2015-10-07 MED ORDER — NICARDIPINE HCL IN NACL 20-0.86 MG/200ML-% IV SOLN
5.0000 mg/h | INTRAVENOUS | Status: DC
  Filled 2015-10-07: qty 200

## 2015-10-07 MED ORDER — HEPARIN (PORCINE) IN NACL 100-0.45 UNIT/ML-% IJ SOLN
500.0000 [IU]/h | INTRAMUSCULAR | Status: AC
  Administered 2015-10-07: 500 [IU]/h via INTRAVENOUS
  Filled 2015-10-07: qty 250

## 2015-10-07 MED ORDER — GLYCOPYRROLATE 0.2 MG/ML IJ SOLN
INTRAMUSCULAR | Status: DC | PRN
  Administered 2015-10-07 (×2): 0.2 mg via INTRAVENOUS

## 2015-10-07 MED ORDER — FENTANYL CITRATE (PF) 100 MCG/2ML IJ SOLN: 25.0000 ug | INTRAMUSCULAR | Status: DC | PRN

## 2015-10-07 MED ORDER — ONDANSETRON HCL 4 MG/2ML IJ SOLN: 4.0000 mg | Freq: Once | INTRAMUSCULAR | Status: DC | PRN

## 2015-10-07 MED ORDER — IOPAMIDOL (ISOVUE-300) INJECTION 61%
INTRAVENOUS | Status: AC
  Filled 2015-10-07: qty 50

## 2015-10-07 MED ORDER — HEPARIN SODIUM (PORCINE) 1000 UNIT/ML IJ SOLN
INTRAMUSCULAR | Status: DC | PRN
  Administered 2015-10-07: 3000 [IU] via INTRAVENOUS
  Administered 2015-10-07 (×3): 500 [IU] via INTRAVENOUS

## 2015-10-07 MED ORDER — NITROGLYCERIN 1 MG/10 ML FOR IR/CATH LAB
INTRA_ARTERIAL | Status: AC
Start: 2015-10-07 — End: 2015-10-07
  Filled 2015-10-07: qty 10

## 2015-10-07 MED ORDER — SODIUM CHLORIDE 0.9 % IV SOLN
INTRAVENOUS | Status: DC | PRN
  Administered 2015-10-07: 09:00:00 via INTRAVENOUS

## 2015-10-07 MED ORDER — DOPAMINE-DEXTROSE 3.2-5 MG/ML-% IV SOLN
0.0000 ug/kg/min | INTRAVENOUS | Status: DC
Start: 2015-10-07 — End: 2015-10-07
  Filled 2015-10-07: qty 250

## 2015-10-07 MED ORDER — SODIUM CHLORIDE 0.9 % IV SOLN
INTRAVENOUS | Status: DC
  Administered 2015-10-07: 23:00:00 via INTRAVENOUS

## 2015-10-07 MED ORDER — LACTATED RINGERS IV SOLN: INTRAVENOUS | Status: DC

## 2015-10-07 MED ORDER — CEFAZOLIN SODIUM-DEXTROSE 2-4 GM/100ML-% IV SOLN
INTRAVENOUS | Status: AC
  Filled 2015-10-07: qty 100

## 2015-10-07 MED ORDER — ASPIRIN EC 81 MG PO TBEC
81.0000 mg | DELAYED_RELEASE_TABLET | Freq: Every day | ORAL | Status: DC
  Administered 2015-10-08: 81 mg via ORAL
  Filled 2015-10-07: qty 1

## 2015-10-07 MED ORDER — ONDANSETRON HCL 4 MG/2ML IJ SOLN
INTRAMUSCULAR | Status: DC | PRN
  Administered 2015-10-07: 4 mg via INTRAVENOUS

## 2015-10-07 MED ORDER — NICARDIPINE HCL IN NACL 20-0.86 MG/200ML-% IV SOLN
INTRAVENOUS | Status: AC
  Filled 2015-10-07: qty 200

## 2015-10-07 NOTE — Sedation Documentation (Signed)
1040 45fr foley inserted

## 2015-10-07 NOTE — Anesthesia Procedure Notes (Signed)
Procedure Name: MAC Date/Time: 10/07/2015 10:28 AM Performed by: Jenne Campus Pre-anesthesia Checklist: Patient identified, Emergency Drugs available, Suction available, Patient being monitored and Timeout performed Patient Re-evaluated:Patient Re-evaluated prior to inductionOxygen Delivery Method: Nasal cannula

## 2015-10-07 NOTE — Progress Notes (Signed)
ANTICOAGULATION CONSULT NOTE - Initial Consult  Pharmacy Consult for Heparin Indication: S/p IR procedure  Allergies  Allergen Reactions  . Statins Other (See Comments)    Joint pain    Patient Measurements: Weight: 174 lb 2.6 oz (79 kg) Heparin Dosing Weight: 79 kg  Vital Signs: Temp: 97.3 F (36.3 C) (05/18 0903) Temp Source: Axillary (05/18 0903) BP: 127/52 mmHg (05/18 0903) Pulse Rate: 40 (05/18 0903)  Labs:  Recent Labs  10/07/15 0732  HGB 11.2*  HCT 33.3*  PLT 154  APTT 30  LABPROT 16.5*  INR 1.32  CREATININE 1.99*    Estimated Creatinine Clearance: 28.6 mL/min (by C-G formula based on Cr of 1.99).   Medical History: Past Medical History  Diagnosis Date  . Diabetes mellitus   . Hypertension   . Myocardial infarction ([place])   . Arthritis   . ARF (acute renal failure) ([place])   . Stroke [facility])     ? 4/20107- multiple Lacunar  . Dementia     short term recently  . Syncope   . GERD (gastroesophageal reflux disease)   . Gout   . Cancer [facility])     Skin cancer- face- Basal  . Carotid stenosis, asymptomatic 12/02/2010    Carotid Occulusion- right  . Hyperlipemia   . Shortness of breath dyspnea     at times  . Hypothyroidism   . Bronchitis   . Restless legs   . Weakness     Assessment: 82 yo M presents on 5/18 for cerebral angiogram with probable angioplasty / stent to left verterbral artery. Pharmacy consulted to follow heparin s/p IR procedure. Hgb low at 11.2, plts wnl.   Goal of Therapy:  Heparin level 0.1-0.25  Monitor platelets by anticoagulation protocol: Yes   Plan:  Continue heparin gtt at 500 units/hr Check 8 hr HL Monitor daily HL, CBC, s/s of bleed Heparin gtt to stop on 5/19 at [place] 10/07/2015,1:52 PM

## 2015-10-07 NOTE — Sedation Documentation (Signed)
Doppler pedal pulses left  faint by doppler, right strong by doppler

## 2015-10-07 NOTE — Procedures (Signed)
S/P Lt Vert A arteriogram,followed by stent assisted angioplasty of symptomatic dominant Lt VA origin stenosis

## 2015-10-07 NOTE — H&P (Signed)
Chief Complaint: Patient was seen in consultation today for cerebral arteriogram with probable angioplasty/stent Left vertebral artery at the request of Dr Audria Nine  Referring Physician(s): Dr Audria Nine  Supervising Physician: Luanne Bras  Patient Status: Out-pt  History of Present Illness: Johnny Murphy is a 82 y.o. male   CVA Recurrent syncope Passed out at restaurant 08/28/15 Admitted through ED MRI 4/9: IMPRESSION: 1. No acute intracranial abnormality. 2. Multiple remote lacunar infarcts of the basal ganglia and cerebellum bilaterally. 3. Remote small cortical infarcts of the right parietal lobe. 4. Abnormal signal within the cavernous right internal carotid artery and the right vertebral artery compatible with occlusions identified on the previous study. Flow is present within the right M1 segment.  Arteriogram 4/10: IMPRESSION: Approximately 75% narrowing of the dominant left vertebral artery at its origin.  Occluded right internal carotid artery just distal to the origin of the right posterior communicating artery.  Occluded right vertebral artery at the level of C1.  Severe stenosis of the left external carotid artery at its origin.  Approximately 30% stenosis of the left internal carotid artery at the bulb by the NASCET criteria without associated ulcerations.  Approximately 50% stenosis of the distal basilar artery, and to a lesser degree of the mid basilar artery.  Mild-to-moderate intracranial arteriosclerosis involving primarily the posterior circulation as described.  Switched to Brilinta as P2Y12 was not therapeutic on Plavix. Have reviewed Cardiology and Anesthesia notes. EF 20%, significant risk of VT/VF, syncope, sudden death.  Pt rescheduled for (L)vert stent angioplasty today   Past Medical History  Diagnosis Date  . Diabetes mellitus   . Hypertension   . Myocardial infarction ([place])   . Arthritis   . ARF (acute  renal failure) ([place])   . Stroke [facility])     ? 4/20107- multiple Lacunar  . Dementia     short term recently  . Syncope   . GERD (gastroesophageal reflux disease)   . Gout   . Cancer [facility])     Skin cancer- face- Basal  . Carotid stenosis, asymptomatic 12/02/2010    Carotid Occulusion- right  . Hyperlipemia   . Shortness of breath dyspnea     at times  . Hypothyroidism   . Bronchitis   . Restless legs     Past Surgical History  Procedure Laterality Date  . Trapease ivc filter  05/01/2002    conditional 5 up to 3T Mri   . Coronary artery bypass graft    . Knee arthroscopy Bilateral   . Coronary angioplasty    . Cardiac stents      x2 - ? 2007 or 2012-[place]  . Colonoscopy w/ polypectomy    . Radiology with anesthesia N/A 09/15/2015    Procedure: ANGIOPLASTY       (RADIOLOGY WITH ANESTHESIA);  Surgeon: Luanne Bras, MD;  Location: [place];  Service: Radiology;  Laterality: N/A;    Allergies: Statins  Medications: Prior to Admission medications   Medication Sig Start Date End Date Taking? Authorizing Provider  aspirin 81 MG tablet Take 81 mg by mouth daily.   Yes Historical Provider, MD  carvedilol (COREG) 6.25 MG tablet Take 1 tablet (6.25 mg total) by mouth 2 (two) times daily with a meal. 09/02/15  Yes Theodis Blaze, MD  cholecalciferol (VITAMIN D) 1000 units tablet Take 1,000 Units by mouth daily.   Yes Historical Provider, MD  clopidogrel (PLAVIX) 75 MG tablet Take 1 tablet (75 mg total) by mouth daily.  09/02/15  Yes Theodis Blaze, MD  colchicine 0.6 MG tablet Take 1 tablet by mouth daily as needed. 07/07/15  Yes Historical Provider, MD  GLIPIZIDE XL 10 MG 24 hr tablet Take 10 mg by mouth daily with breakfast.  09/08/10  Yes Historical Provider, MD  insulin glargine (LANTUS) 100 UNIT/ML injection Inject 10 Units into the skin at bedtime.   Yes Historical Provider, MD  levothyroxine (SYNTHROID, LEVOTHROID) 25 MCG tablet Take 25 mcg by mouth daily before breakfast.   Yes  Historical Provider, MD  lisinopril (PRINIVIL,ZESTRIL) 5 MG tablet Take 1 tablet (5 mg total) by mouth daily. 09/02/15  Yes Theodis Blaze, MD  Multiple Vitamins-Minerals (MULTIVITAMIN WITH MINERALS) tablet Take 1 tablet by mouth daily.   Yes Historical Provider, MD  nitroGLYCERIN (NITROSTAT) 0.4 MG SL tablet Place 0.4 mg under the tongue every 5 (five) minutes as needed for chest pain.  08/10/15  Yes Historical Provider, MD  omeprazole (PRILOSEC) 20 MG capsule Take 20 mg by mouth daily.  09/08/10  Yes Historical Provider, MD  pravastatin (PRAVACHOL) 20 MG tablet Take 20 mg by mouth 3 (three) times a week.  11/04/10  Yes Historical Provider, MD  saxagliptin HCl (ONGLYZA) 5 MG TABS tablet Take 5 mg by mouth daily.   Yes Historical Provider, MD  vitamin B-12 (CYANOCOBALAMIN) 1000 MCG tablet Take 1,000 mcg by mouth daily.   Yes Historical Provider, MD     No family history on file.  Social History   Social History  . Marital Status: Married    Spouse Name: N/A  . Number of Children: N/A  . Years of Education: N/A   Social History Main Topics  . Smoking status: Former Smoker    Types: Cigarettes  . Smokeless tobacco: Former Systems developer    Quit date: 11/01/1977  . Alcohol Use: No  . Drug Use: No  . Sexual Activity: Not on file   Other Topics Concern  . Not on file   Social History Narrative     Review of Systems: A 12 point ROS discussed and pertinent positives are indicated in the HPI above.  All other systems are negative.  Review of Systems  Constitutional: Negative for fever, activity change, appetite change and fatigue.  HENT: Negative for tinnitus, trouble swallowing and voice change.   Eyes: Negative for visual disturbance.  Respiratory: Negative for shortness of breath.   Gastrointestinal: Negative for abdominal pain.  Musculoskeletal: Negative for gait problem.  Neurological: Positive for syncope and light-headedness. Negative for dizziness, tremors, seizures, facial asymmetry,  weakness, numbness and headaches.  Psychiatric/Behavioral: Negative for behavioral problems and confusion.    Vital Signs: T: 97.3, HR: 40, RR: 16, BP: 127/52  Physical Exam  Constitutional: He is oriented to person, place, and time. He appears well-nourished.  HENT:  Head: Atraumatic.  Eyes: EOM are normal.  Neck: Neck supple.  Cardiovascular: Normal rate, regular rhythm and normal heart sounds.   No murmur heard. Pulmonary/Chest: Effort normal and breath sounds normal. He has no wheezes.  Abdominal: Soft. Bowel sounds are normal. There is no tenderness.  Musculoskeletal: Normal range of motion.  Neurological: He is alert and oriented to person, place, and time.  Skin: Skin is warm and dry.  Psychiatric: He has a normal mood and affect. His behavior is normal. Judgment and thought content normal.  Nursing note and vitals reviewed.   Imaging: No results found.  Labs:  CBC:  Recent Labs  08/30/15 0430 09/02/15 0526 09/15/15 0734 10/07/15 0732  WBC 7.6 9.2 7.7 6.2  HGB 12.3* 12.1* 11.4* 11.2*  HCT 36.1* 36.6* 34.6* 33.3*  PLT 163 159 252 154    COAGS:  Recent Labs  08/30/15 0731 09/15/15 0734 10/07/15 0732  INR 1.05 1.18 1.32  APTT 27 29 30     BMP:  Recent Labs  09/01/15 0522 09/02/15 0526 09/15/15 0734 10/07/15 0732  NA 137 137 136 138  K 4.1 4.3 4.5 4.5  CL 104 104 107 106  CO2 22 22 20* 19*  GLUCOSE 242* 173* 142* 122*  BUN 21* 18 31* 33*  CALCIUM 8.4* 9.0 8.9 8.8*  CREATININE 1.43* 1.46* 1.77* 1.99*  GFRNONAA 45* 44* 35* 30*  GFRAA 52* 51* 40* 35*    LIVER FUNCTION TESTS:  Recent Labs  08/29/15 0530 09/15/15 0734  BILITOT 0.7 0.6  AST 31 30  ALT 26 25  ALKPHOS 77 69  PROT 5.5* 5.8*  ALBUMIN 3.0* 3.2*     Assessment and Plan:  Syncope CVA Cerebral arteriogram 4/10 revealing L VA stenosis Now scheduled for L VA pta/stent Risks and Benefits discussed with the patient and his family including, but not limited to bleeding,  infection, vascular injury, contrast induced renal failure, stroke or even death. His renal function is worsening and he is at significant risk for AKI with this procedure. He is also at risk for significant cardiac issues in this setting. The pt and his family understand the risks involved and are still willing to proceed. Will not use general anesthesia given cardiac risks, will attempt with local anethesia to the groin and sedation.  May need Cardiology, Nephrology, and IM eval during this admission All of the patient's questions were answered, patient is agreeable to proceed. Consent signed and in chart.  Pt aware if intervention is performed, he will be admitted to Neuro ICU overnight and plan to discharge in am Agreeable   Electronically Signed: Ascencion Dike 10/07/2015, 9:04 AM   I spent a total of  30 Minutes   in face to face in clinical consultation, greater than 50% of which was counseling/coordinating care for L VA pta/stent

## 2015-10-07 NOTE — Transfer of Care (Signed)
Immediate Anesthesia Transfer of Care Note  Patient: Johnny Murphy  Procedure(s) Performed: Procedure(s): Angioplasty with possible stent (N/A)  Patient Location: PACU  Anesthesia Type:General  Level of Consciousness: awake, alert , oriented and patient cooperative  Airway & Oxygen Therapy: Patient Spontanous Breathing and Patient connected to nasal cannula oxygen  Post-op Assessment: Report given to RN and Post -op Vital signs reviewed and stable  Post vital signs: Reviewed  Last Vitals:  Filed Vitals:   10/07/15 0903  BP: 127/52  Pulse: 40  Temp: 36.3 C  Resp: 16    Last Pain: There were no vitals filed for this visit.       Complications: No apparent anesthesia complications

## 2015-10-07 NOTE — Progress Notes (Signed)
ANTICOAGULATION CONSULT NOTE - Follow Up Consult  Pharmacy Consult for heparin Indication: s/p cerebral angiogram   Labs:  Recent Labs  10/07/15 0732 10/07/15 2247  HGB 11.2*  --   HCT 33.3*  --   PLT 154  --   APTT 30  --   LABPROT 16.5*  --   INR 1.32  --   HEPARINUNFRC  --  0.19*  CREATININE 1.99*  --     Assessment/Plan:  82yo male therapeutic on heparin with initial dosing s/p cerebral angiogram. Will continue gtt at current rate and confirm stable with am labs.   Wynona Neat, PharmD, BCPS  10/07/2015,11:57 PM

## 2015-10-07 NOTE — Consult Note (Signed)
CARDIOLOGY CONSULT NOTE       Patient ID: Johnny Murphy MRN: [id number] DOB/AGE: 20-Feb-1933 82 y.o.  Admit date: 10/07/2015 Referring Physician:  Estanislado Pandy Primary Physician: Gilford Rile, MD Primary Cardiologist:  Peggye Fothergill Saw Hilty 09/01/15 here Reason for Consultation:  DCM Bradycardia  Active Problems:   VBI (vertebrobasilar insufficiency)   HPI:  82 y.o. with known vascular disease. Multiple syncopal spells last few months. Distant CABG with f/u stenting of LM/circumflex 2008.  Seen by Dr Bettina Gavia last week and plans for cath ? AICD after IR intervention. Has occluded RICA and vertebral with multiple posterior circulation strokes thought to be related to dizzy spells and syncope. Denies SSCP. Last echo EF noted to be 25% Apical clot r/o by Definity. No active CHF.  In PACU noted to be in slow afib. Took coreg this am. Post procedure with vertebral artery stent with need for DAT.  Also history of PVD with failed Right SFA artherectomy in 2016.  Currently no chest pain, dizziness, or dyspnea   ROS All other systems reviewed and negative except as noted above  Past Medical History  Diagnosis Date  . Diabetes mellitus   . Hypertension   . Myocardial infarction ([place])   . Arthritis   . ARF (acute renal failure) ([place])   . Stroke [facility])     ? 4/20107- multiple Lacunar  . Dementia     short term recently  . Syncope   . GERD (gastroesophageal reflux disease)   . Gout   . Cancer [facility])     Skin cancer- face- Basal  . Carotid stenosis, asymptomatic 12/02/2010    Carotid Occulusion- right  . Hyperlipemia   . Shortness of breath dyspnea     at times  . Hypothyroidism   . Bronchitis   . Restless legs   . Weakness     History reviewed. No pertinent family history.  Social History   Social History  . Marital Status: Married    Spouse Name: N/A  . Number of Children: N/A  . Years of Education: N/A   Occupational History  . Not on file.   Social History Main Topics  .  Smoking status: Former Smoker    Types: Cigarettes  . Smokeless tobacco: Former Systems developer    Quit date: 11/01/1977  . Alcohol Use: No  . Drug Use: No  . Sexual Activity: Not on file   Other Topics Concern  . Not on file   Social History Narrative    Past Surgical History  Procedure Laterality Date  . Trapease ivc filter  05/01/2002    conditional 5 up to 3T Mri   . Coronary artery bypass graft    . Knee arthroscopy Bilateral   . Coronary angioplasty    . Cardiac stents      x2 - ? 2007 or 2012-[place]  . Colonoscopy w/ polypectomy    . Radiology with anesthesia N/A 09/15/2015    Procedure: ANGIOPLASTY       (RADIOLOGY WITH ANESTHESIA);  Surgeon: Luanne Bras, MD;  Location: [place];  Service: Radiology;  Laterality: N/A;     . [START ON 10/08/2015] aspirin  325 mg Oral Q breakfast  . heparin      . niCARdipine in saline      . nitroGLYCERIN       . sodium chloride    . heparin 500 Units/hr (10/07/15 1429)  . lactated ringers    . niCARDipine 5 mg/hr (10/07/15 1513)    Physical  Exam: Blood pressure 130/62, pulse 50, temperature 97.9 F (36.6 C), temperature source Axillary, resp. rate 15, height 5\' 8"  (1.727 m), weight 79 kg (174 lb 2.6 oz), SpO2 98 %.   Affect appropriate Chronically ill white male  HEENT: normal Neck supple with no adenopathy JVP normal no bruits no thyromegaly Lungs clear with no wheezing and good diaphragmatic motion Heart:  S1/S2 SEM  murmur, no rub, gallop or click PMI normal Abdomen: benighn, BS positve, no tenderness, no AAA no bruit.  No HSM or HJR Bilateral femoral bruits RFA cath sight A  No edema Neuro non-focal Skin warm and dry No muscular weakness   Labs:   Lab Results  Component Value Date   WBC 6.2 10/07/2015   HGB 11.2* 10/07/2015   HCT 33.3* 10/07/2015   MCV 94.1 10/07/2015   PLT 154 10/07/2015    Recent Labs Lab 10/07/15 0732  NA 138  K 4.5  CL 106  CO2 19*  BUN 33*  CREATININE 1.99*  CALCIUM 8.8*    GLUCOSE 122*   Lab Results  Component Value Date   TROPONINI 0.03 08/29/2015    Lab Results  Component Value Date   CHOL 180 08/29/2015   Lab Results  Component Value Date   HDL 30* 08/29/2015   Lab Results  Component Value Date   LDLCALC 95 08/29/2015   Lab Results  Component Value Date   TRIG 274* 08/29/2015   Lab Results  Component Value Date   CHOLHDL 6.0 08/29/2015   No results found for: LDLDIRECT    Radiology: No results found.  EKG: slow afib rate 57 with anterolateral T wave changes no old tracing to compare    ASSESSMENT AND PLAN:  CAD/CABG:  Distant no angina stable on DAT for cerebrovascular disease hold coreg for bradycardia CHF:  08/2015 Echo with EF 25-30% Seen by primary cardiologist and plans for cath 2 weeks post neuro Intervention with eye towards AICD Bradycardia: no old ECG Notes Dr Debara Pickett note SB with first and second degree block last month. Consistent with SSS Hold coreg follow on telemetry no anticoagulation for now given recent stent and need for DAT and need for upcoming cath at [facility]  Neuro: post left dominant vertebral artery stent cath sight A no new neuro symptoms although he appears to have some chronic dementia  Signed: Jenkins Rouge 10/07/2015, 3:30 PM

## 2015-10-07 NOTE — Progress Notes (Signed)
Patient ID: Johnny Murphy, male   DOB: January 19, 1933, 82 y.o.   MRN: [id number]    Referring Physician(s): Nandigam,Ram  Supervising Physician: Luanne Bras  Patient Status: In-pt  Chief Complaint: Recurrent syncope, left vertebral artery stenosis   Subjective: Pt doing ok; has some mild positional back pain; no new neuro complaints  Allergies: Statins  Medications: Prior to Admission medications   Medication Sig Start Date End Date Taking? Authorizing Provider  amLODipine (NORVASC) 5 MG tablet Take 5 mg by mouth daily. 08/20/15  Yes Historical Provider, MD  aspirin 81 MG tablet Take 81 mg by mouth 2 (two) times daily.    Yes Historical Provider, MD  carvedilol (COREG) 25 MG tablet Take 25 mg by mouth 2 (two) times daily. 08/20/15  Yes Historical Provider, MD  cholecalciferol (VITAMIN D) 1000 units tablet Take 1,000 Units by mouth daily.   Yes Historical Provider, MD  GLIPIZIDE XL 10 MG 24 hr tablet Take 10 mg by mouth daily with breakfast.  09/08/10  Yes Historical Provider, MD  ibuprofen (ADVIL,MOTRIN) 200 MG tablet Take 400 mg by mouth daily as needed for moderate pain.   Yes Historical Provider, MD  Insulin Glargine (LANTUS SOLOSTAR) 100 UNIT/ML Solostar Pen Inject 10-16 Units into the skin at bedtime.   Yes Historical Provider, MD  levothyroxine (SYNTHROID, LEVOTHROID) 25 MCG tablet Take 25 mcg by mouth daily before breakfast.   Yes Historical Provider, MD  lisinopril (PRINIVIL,ZESTRIL) 5 MG tablet Take 1 tablet (5 mg total) by mouth daily. 09/02/15  Yes Theodis Blaze, MD  Multiple Vitamins-Minerals (MULTIVITAMIN WITH MINERALS) tablet Take 1 tablet by mouth daily.   Yes Historical Provider, MD  omeprazole (PRILOSEC) 20 MG capsule Take 20 mg by mouth daily.  09/08/10  Yes Historical Provider, MD  pravastatin (PRAVACHOL) 20 MG tablet Take 20 mg by mouth every Monday, Wednesday, and Friday.  11/04/10  Yes Historical Provider, MD  saxagliptin HCl (ONGLYZA) 5 MG TABS tablet Take 5 mg  by mouth daily.   Yes Historical Provider, MD  ticagrelor (BRILINTA) 90 MG TABS tablet Take 90 mg by mouth 2 (two) times daily.   Yes Historical Provider, MD  vitamin B-12 (CYANOCOBALAMIN) 1000 MCG tablet Take 1,000 mcg by mouth daily.   Yes Historical Provider, MD  colchicine 0.6 MG tablet Take 0.6 mg by mouth daily as needed (gout flare ups).  07/07/15   Historical Provider, MD  nitroGLYCERIN (NITROSTAT) 0.4 MG SL tablet Place 0.4 mg under the tongue every 5 (five) minutes as needed for chest pain.  08/10/15   Historical Provider, MD     Vital Signs: BP 111/40 mmHg  Pulse 53  Temp(Src) 97.6 F (36.4 C) (Axillary)  Resp 19  Ht 5\' 8"  (1.727 m)  Wt 174 lb 2.6 oz (79 kg)  BMI 26.49 kg/m2  SpO2 99%  Physical Exam awake/alert; speech nl, EOMI, face symm, tongue midline; strength 5/5 all fours; rt groin CFA site with small amt blood on gauze, soft, NT, no distinct hematoma; intact distal pulses  Imaging: No results found.  Labs:  CBC:  Recent Labs  08/30/15 0430 09/02/15 0526 09/15/15 0734 10/07/15 0732  WBC 7.6 9.2 7.7 6.2  HGB 12.3* 12.1* 11.4* 11.2*  HCT 36.1* 36.6* 34.6* 33.3*  PLT 163 159 252 154    COAGS:  Recent Labs  08/30/15 0731 09/15/15 0734 10/07/15 0732  INR 1.05 1.18 1.32  APTT 27 29 30     BMP:  Recent Labs  09/01/15 0522 09/02/15 0526 09/15/15  0734 10/07/15 0732  NA 137 137 136 138  K 4.1 4.3 4.5 4.5  CL 104 104 107 106  CO2 22 22 20* 19*  GLUCOSE 242* 173* 142* 122*  BUN 21* 18 31* 33*  CALCIUM 8.4* 9.0 8.9 8.8*  CREATININE 1.43* 1.46* 1.77* 1.99*  GFRNONAA 45* 44* 35* 30*  GFRAA 52* 51* 40* 35*    LIVER FUNCTION TESTS:  Recent Labs  08/29/15 0530 09/15/15 0734  BILITOT 0.7 0.6  AST 31 30  ALT 26 25  ALKPHOS 77 69  PROT 5.5* 5.8*  ALBUMIN 3.0* 3.2*    Assessment and Plan: S/p left vert arteriogram with stent assisted PTA of dominant left vert origin stenosis 5/18; for overnight obs; IV heparin, cont oral anticoagulants,  hold coreg for now; neuro checks; mild hydration; check am labs   Electronically Signed: D. Rowe Robert 10/07/2015, 6:03 PM   I spent a total of 20 minutes at the the patient's bedside AND on the patient's hospital floor or unit, greater than 50% of which was counseling/coordinating care for cerebral arteriogram with stent-assisted angioplasty of  left vertebral artery origin stenosis

## 2015-10-07 NOTE — Progress Notes (Signed)
Patient had foley placed in interventional radiology. Clean intact, no loops, seal closed, bag below bladder emptied before transport

## 2015-10-07 NOTE — Sedation Documentation (Signed)
Anesthesia sedating patient

## 2015-10-08 ENCOUNTER — Encounter (HOSPITAL_COMMUNITY): Payer: Self-pay | Admitting: Interventional Radiology

## 2015-10-08 LAB — CBC WITH DIFFERENTIAL/PLATELET
BASOS ABS: 0 10*3/uL (ref 0.0–0.1)
BASOS PCT: 0 %
EOS PCT: 1 %
Eosinophils Absolute: 0.1 10*3/uL (ref 0.0–0.7)
HEMATOCRIT: 29.5 % — AB (ref 39.0–52.0)
Hemoglobin: 9.6 g/dL — ABNORMAL LOW (ref 13.0–17.0)
Lymphocytes Relative: 17 %
Lymphs Abs: 1.2 10*3/uL (ref 0.7–4.0)
MCH: 30 pg (ref 26.0–34.0)
MCHC: 32.5 g/dL (ref 30.0–36.0)
MCV: 92.2 fL (ref 78.0–100.0)
MONO ABS: 0.6 10*3/uL (ref 0.1–1.0)
MONOS PCT: 8 %
Neutro Abs: 5.3 10*3/uL (ref 1.7–7.7)
Neutrophils Relative %: 74 %
PLATELETS: 187 10*3/uL (ref 150–400)
RBC: 3.2 MIL/uL — ABNORMAL LOW (ref 4.22–5.81)
RDW: 13.9 % (ref 11.5–15.5)
WBC: 7.1 10*3/uL (ref 4.0–10.5)

## 2015-10-08 LAB — BASIC METABOLIC PANEL
Anion gap: 12 (ref 5–15)
BUN: 28 mg/dL — AB (ref 6–20)
CALCIUM: 8.4 mg/dL — AB (ref 8.9–10.3)
CO2: 17 mmol/L — AB (ref 22–32)
CREATININE: 2 mg/dL — AB (ref 0.61–1.24)
Chloride: 107 mmol/L (ref 101–111)
GFR calc Af Amer: 35 mL/min — ABNORMAL LOW (ref >60)
GFR, EST NON AFRICAN AMERICAN: 30 mL/min — AB (ref >60)
GLUCOSE: 207 mg/dL — AB (ref 65–99)
Potassium: 4.6 mmol/L (ref 3.5–5.1)
Sodium: 136 mmol/L (ref 135–145)

## 2015-10-08 LAB — HEPARIN LEVEL (UNFRACTIONATED)

## 2015-10-08 LAB — GLUCOSE, CAPILLARY
GLUCOSE-CAPILLARY: 183 mg/dL — AB (ref 65–99)
GLUCOSE-CAPILLARY: 204 mg/dL — AB (ref 65–99)

## 2015-10-08 NOTE — Progress Notes (Signed)
Patient ID: Johnny Murphy, male   DOB: 01/25/1933, 82 y.o.   MRN: [id number]    Subjective:  Denies SSCP, palpitations or Dyspnea   Objective:  Filed Vitals:   10/08/15 0430 10/08/15 0500 10/08/15 0600 10/08/15 0700  BP: 140/51 143/61 148/62 136/68  Pulse: 55 64 56 55  Temp:      TempSrc:      Resp: 16 17 20 19   Height:      Weight:      SpO2: 97% 98% 97% 97%    Intake/Output from previous day:  Intake/Output Summary (Last 24 hours) at 10/08/15 0728 Last data filed at 10/08/15 0700  Gross per 24 hour  Intake 1945.5 ml  Output   1425 ml  Net  520.5 ml    Physical Exam: Affect appropriate Chronically ill white male  HEENT: normal Neck supple with no adenopathy JVP normal Bilateral  bruits no thyromegaly Lungs clear with no wheezing and good diaphragmatic motion Heart:  S1/S2 SEM  murmur, no rub, gallop or click PMI normal Abdomen: benighn, BS positve, no tenderness, no AAA no bruit.  No HSM or HJR Distal pulses intact with no bruits No edema Neuro non-focal Skin warm and dry No muscular weakness   Lab Results: Basic Metabolic Panel:  Recent Labs  10/07/15 0732 10/08/15 0441  NA 138 136  K 4.5 4.6  CL 106 107  CO2 19* 17*  GLUCOSE 122* 207*  BUN 33* 28*  CREATININE 1.99* 2.00*  CALCIUM 8.8* 8.4*   CBC:  Recent Labs  10/07/15 0732 10/08/15 0441  WBC 6.2 7.1  NEUTROABS 4.1 5.3  HGB 11.2* 9.6*  HCT 33.3* 29.5*  MCV 94.1 92.2  PLT 154 187    Imaging: No results found.  Cardiac Studies:  ECG:  afib ICLBBB lateral T wave changes no old to compare    Telemetry: afib rates 55-65  Echo:   Medications:   . aspirin EC  81 mg Oral Daily  . insulin aspart  0-15 Units Subcutaneous TID WC  . levothyroxine  25 mcg Oral QAC breakfast  . lisinopril  5 mg Oral Daily  . pantoprazole  40 mg Oral Daily  . ticagrelor  90 mg Oral BID     . sodium chloride 75 mL/hr at 10/08/15 0339  . lactated ringers    . niCARDipine Stopped (10/08/15 0300)     Assessment/Plan:  CAD/CABG: Distant no angina stable on DAT for cerebrovascular disease hold coreg for bradycardia CHF: 08/2015 Echo with EF 25-30% Seen by primary cardiologist and plans for cath 2 weeks post neuro Intervention with eye towards AICD no signs of volume overload this am CR stable post contrast  Bradycardia: no old ECG Notes Dr Debara Pickett note SB with first and second degree block last month. Consistent with SSS Hold coreg  no anticoagulation for now given recent stent and need for DAT and need for upcoming cath at [facility]  Neuro: post left dominant vertebral artery stent cath sight A no new neuro symptoms although he appears to have some chronic dementia  Ok to d/c home with f/u Dr Bettina Gavia HP  Jenkins Rouge 10/08/2015, 7:28 AM

## 2015-10-08 NOTE — Progress Notes (Signed)
Patient voided several times since foley removal.  Walked patient around unit without complications.  AVS signed and given to patient.  Discharge instructions gone over with patient and family and all questions answered.  Discharged patient.

## 2015-10-08 NOTE — Discharge Summary (Signed)
Patient ID: Johnny Murphy MRN: [id number] DOB/AGE: [birth year] 82 y.o.  Admit date: 10/07/2015 Discharge date: 10/08/2015  Supervising Physician: Luanne Bras  Admission Diagnoses: L VA stenosis  Discharge Diagnoses:  Active Problems:   Cardiomyopathy ([place])   VBI (vertebrobasilar insufficiency)   CAD (coronary artery disease)   Bradycardia   PAF (paroxysmal atrial fibrillation) ([place])   Discharged Condition: good  Hospital Course: the patient was admitted and underwent  Leftt Verterbral Artery arteriogram, followed by stent assisted angioplasty of symptomatic dominant Left VA origin stenosis.  The patient tolerated the procedure well.  On POD 1, the patient was doing well.  He was tolerating his diet.  His foley was removed and he was mobilized with the nursing staff.  He was felt stable for dc home on his Brilinta and ASA.  Consults: cardiology  Discharge Exam: Blood pressure 145/62, pulse 55, temperature 97.6 F (36.4 C), temperature source Oral, resp. rate 26, height 5\' 8"  (1.727 m), weight 174 lb 2.6 oz (79 kg), SpO2 96 %. General appearance: alert and cooperative Neurologic: Grossly normal  Disposition: 01-Home or Self Care     Medication List    TAKE these medications        amLODipine 5 MG tablet  Commonly known as:  NORVASC  Take 5 mg by mouth daily.     aspirin 81 MG tablet  Take 81 mg by mouth 2 (two) times daily.     carvedilol 25 MG tablet  Commonly known as:  COREG  Take 25 mg by mouth 2 (two) times daily.     cholecalciferol 1000 units tablet  Commonly known as:  VITAMIN D  Take 1,000 Units by mouth daily.     colchicine 0.6 MG tablet  Take 0.6 mg by mouth daily as needed (gout flare ups).     GLIPIZIDE XL 10 MG 24 hr tablet  Generic drug:  glipiZIDE  Take 10 mg by mouth daily with breakfast.     ibuprofen 200 MG tablet  Commonly known as:  ADVIL,MOTRIN  Take 400 mg by mouth daily as needed for moderate pain.     LANTUS SOLOSTAR  100 UNIT/ML Solostar Pen  Generic drug:  Insulin Glargine  Inject 10-16 Units into the skin at bedtime.     levothyroxine 25 MCG tablet  Commonly known as:  SYNTHROID, LEVOTHROID  Take 25 mcg by mouth daily before breakfast.     lisinopril 5 MG tablet  Commonly known as:  ZESTRIL  Take 1 tablet (5 mg total) by mouth daily.     multivitamin with minerals tablet  Take 1 tablet by mouth daily.     nitroGLYCERIN 0.4 MG SL tablet  Commonly known as:  NITROSTAT  Place 0.4 mg under the tongue every 5 (five) minutes as needed for chest pain.     omeprazole 20 MG capsule  Commonly known as:  PRILOSEC  Take 20 mg by mouth daily.     ONGLYZA 5 MG Tabs tablet  Generic drug:  saxagliptin HCl  Take 5 mg by mouth daily.     pravastatin 20 MG tablet  Commonly known as:  PRAVACHOL  Take 20 mg by mouth every Monday, Wednesday, and Friday.     ticagrelor 90 MG Tabs tablet  Commonly known as:  BRILINTA  Take 90 mg by mouth 2 (two) times daily.     vitamin B-12 1000 MCG tablet  Commonly known as:  CYANOCOBALAMIN  Take 1,000 mcg by mouth daily.  Follow-up Information    Follow up with DEVESHWAR, Fritz Pickerel, MD In 2 weeks.   Specialty:  Interventional Radiology   Why:  Johnny Murphy will call you   Contact information:   [address] [place] [address] [phone]        Electronically Signed: Henreitta Cea 10/08/2015, 9:05 AM   I have spent Less Than 30 Minutes discharging Johnny Murphy.

## 2015-10-08 NOTE — Plan of Care (Signed)
Problem: Phase I Progression Outcomes Goal: Other Phase I Outcomes/Goals Outcome: Completed/Met Date Met:  10/08/15 Brilinta medication explained and ordered.  Brilinta card given by case manager.  Problem: Phase II Progression Outcomes Goal: Ambulates up to 600 ft. in hall x 1 Outcome: Completed/Met Date Met:  10/08/15 Walked around unit without complications.

## 2015-10-08 NOTE — Care Management Note (Signed)
Case Management Note  Patient Details  Name: Johnny Murphy MRN: [id number] Date of Birth: 1933/04/01  Subjective/Objective:    Pt admitted on 5/18 post left dominant vertebral artery stent.  PTA, pt independent, lives with spouse.        Action/Plan: Pt to dc home on Brilinta.  30 day free Brilinta card given to pt.  Pt states wife to provide care at home.  No dc needs identified  Expected Discharge Date:    10/08/15              Expected Discharge Plan:  Home/Self Care  In-House Referral:     Discharge planning Services  CM Consult, Medication Assistance  Post Acute Care Choice:    Choice offered to:     DME Arranged:    DME Agency:     HH Arranged:    [place] Agency:     Status of Service:  Completed, signed off  Medicare Important Message Given:    Date Medicare IM Given:    Medicare IM give by:    Date Additional Medicare IM Given:    Additional Medicare Important Message give by:     If discussed at [place] of Stay Meetings, dates discussed:    Additional Comments:  Reinaldo Raddle, RN, BSN  Trauma/Neuro ICU Case Manager [phone]

## 2015-10-08 NOTE — Discharge Instructions (Signed)
Cerebral Angiogram, Care After Refer to this sheet in the next few weeks. These instructions provide you with information on caring for yourself after your procedure. Your health care provider may also give you more specific instructions. Your treatment has been planned according to current medical practices, but problems sometimes occur. Call your health care provider if you have any problems or questions after your procedure. WHAT TO EXPECT AFTER THE PROCEDURE After your procedure, it is typical to have the following:  Bruising at the catheter insertion site that usually fades within 1-2 weeks.  Blood collecting in the tissue (hematoma) that may be painful to the touch. It should usually decrease in size and tenderness within 1-2 weeks.  A mild headache. HOME CARE INSTRUCTIONS  Take medicines only as directed by your health care provider.  You may shower 24-48 hours after the procedure or as directed by your health care provider. Remove the bandage (dressing) and gently wash the site with plain soap and water. Pat the area dry with a clean towel. Do not rub the site, because this may cause bleeding.  Do not take baths, swim, or use a hot tub until your health care provider approves.  Check your insertion site every day for redness, swelling, or drainage.  Do not apply powder or lotion to the site.  Do not lift over 10 lb (4.5 kg) for 5 days after your procedure or as directed by your health care provider.  Ask your health care provider when it is okay to:  Return to work or school.  Resume usual physical activities or sports.  Resume sexual activity.  Do not drive home if you are discharged the same day as the procedure. Have someone else drive you.  You may drive 1-2 weeks after the procedure unless otherwise instructed by your health care provider.  Do not operate machinery or power tools for 1-2 weeks after the procedure or as directed by your health care provider.  If your  procedure was done as an outpatient procedure, which means that you went home the same day as your procedure, a responsible adult should be with you for the first 24 hours after you arrive home.  Keep all follow-up visits as directed by your health care provider. This is important. SEEK MEDICAL CARE IF:  You have a fever.  You have chills.  You have increased bleeding from the catheter insertion site. Hold pressure on the site. SEEK IMMEDIATE MEDICAL CARE IF:  You have vision changes or loss of vision.  You have numbness or weakness on one side of your body.  You have difficulty talking, or you have slurred speech or cannot speak (aphasia).  You feel confused or have difficulty remembering.  You have unusual pain at the catheter insertion site.  You have redness, warmth, or swelling at the catheter insertion site.  You have drainage (other than a small amount of blood on the dressing) from the catheter insertion site.  The catheter insertion site is bleeding, and the bleeding does not stop after 30 minutes of holding steady pressure on the site. These symptoms may represent a serious problem that is an emergency. Do not wait to see if the symptoms will go away. Get medical help right away. Call your local emergency services (911 in [place] Do not drive yourself to the hospital.   This information is not intended to replace advice given to you by your health care provider. Make sure you discuss any questions you have with your  health care provider. °  °Document Released: 09/22/2013 Document Revised: 02/24/2014 Document Reviewed: 09/22/2013 °Elsevier Interactive Patient Education ©2016 [employer] Inc[employer] ° °

## 2015-10-11 NOTE — Anesthesia Postprocedure Evaluation (Signed)
Anesthesia Post Note  Patient: Johnny Murphy  Procedure(s) Performed: Procedure(s) (LRB): Angioplasty with possible stent (N/A)  Patient location during evaluation: PACU Anesthesia Type: General Level of consciousness: awake Pain management: pain level controlled Vital Signs Assessment: post-procedure vital signs reviewed and stable Respiratory status: spontaneous breathing Cardiovascular status: stable Postop Assessment: no signs of nausea or vomiting Anesthetic complications: no     Last Vitals:  Filed Vitals:   10/08/15 1121 10/08/15 1200  BP:  152/46  Pulse:  69  Temp: 36.4 C   Resp:  20    Last Pain: There were no vitals filed for this visit. Pain Goal:                 Sylvanus Telford JR,JOHN Marke Goodwyn

## 2015-10-12 ENCOUNTER — Other Ambulatory Visit (HOSPITAL_COMMUNITY): Payer: Self-pay | Admitting: Interventional Radiology

## 2015-10-12 DIAGNOSIS — I771 Stricture of artery: Secondary | ICD-10-CM

## 2015-10-21 DIAGNOSIS — N183 Chronic kidney disease, stage 3 (moderate): Secondary | ICD-10-CM

## 2015-10-21 DIAGNOSIS — I4891 Unspecified atrial fibrillation: Secondary | ICD-10-CM | POA: Diagnosis not present

## 2015-10-21 DIAGNOSIS — E039 Hypothyroidism, unspecified: Secondary | ICD-10-CM | POA: Diagnosis not present

## 2015-10-21 DIAGNOSIS — I5043 Acute on chronic combined systolic (congestive) and diastolic (congestive) heart failure: Secondary | ICD-10-CM | POA: Diagnosis not present

## 2015-10-22 DIAGNOSIS — I4891 Unspecified atrial fibrillation: Secondary | ICD-10-CM | POA: Diagnosis not present

## 2015-10-22 DIAGNOSIS — N183 Chronic kidney disease, stage 3 (moderate): Secondary | ICD-10-CM | POA: Diagnosis not present

## 2015-10-22 DIAGNOSIS — I5043 Acute on chronic combined systolic (congestive) and diastolic (congestive) heart failure: Secondary | ICD-10-CM

## 2015-10-22 DIAGNOSIS — E039 Hypothyroidism, unspecified: Secondary | ICD-10-CM

## 2015-10-22 DIAGNOSIS — E119 Type 2 diabetes mellitus without complications: Secondary | ICD-10-CM | POA: Diagnosis not present

## 2015-10-29 ENCOUNTER — Ambulatory Visit (HOSPITAL_COMMUNITY)
Admission: RE | Admit: 2015-10-29 | Discharge: 2015-10-29 | Disposition: A | Discharge status: Home / Self Care | Payer: Medicare Other | Source: Ambulatory Visit | Attending: Interventional Radiology | Admitting: Interventional Radiology

## 2015-10-29 DIAGNOSIS — I771 Stricture of artery: Secondary | ICD-10-CM

## 2015-12-28 ENCOUNTER — Telehealth (HOSPITAL_COMMUNITY): Payer: Self-pay

## 2015-12-28 NOTE — Telephone Encounter (Signed)
Called to schedule f/u ultrasound. Pt stated that per Deveshwar they could have the ultrasound done in [place]. They go to see Dr. Bettina Gavia some time next week and will have him order ultrasound and get it done there. Will get report once the exam is done. AW

## 2016-05-22 DEATH — deceased

## 2017-03-28 IMAGING — XA IR ANGIO INTRA EXTRACRAN SEL COM CAROTID INNOMINATE BILAT MOD SE
1 series · 12 of 24 positions shown · IV contrast (IODINE)
Comparison: none

CLINICAL DATA: Vertebrobasilar ischemia. Syncope. Previous history
of multiple cerebellar ischemic strokes. Occluded right internal
carotid artery intracranially.

[Series 300: dr. (person_name) · 12 of 188 slices shown]
[im 9/188]
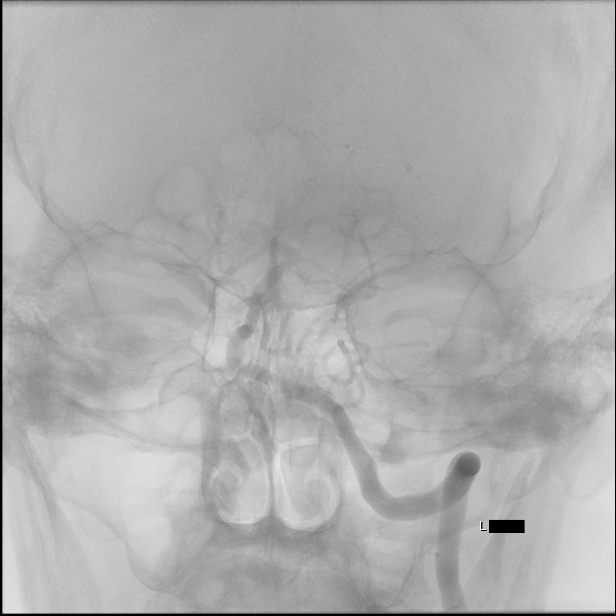
[im 25/188]
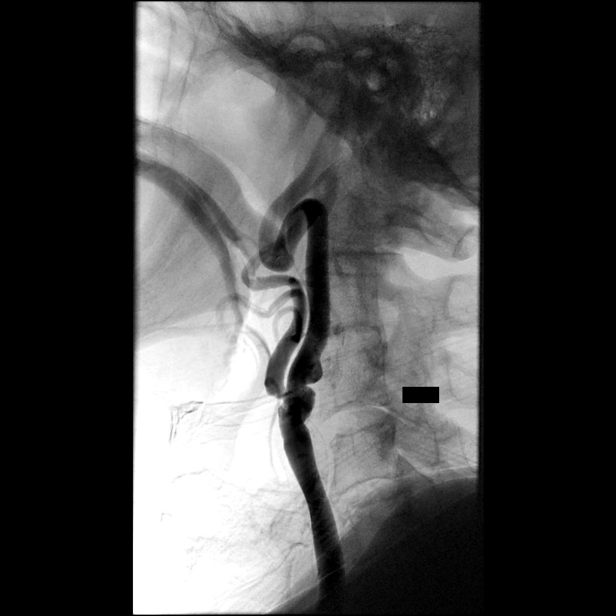
[im 41/188]
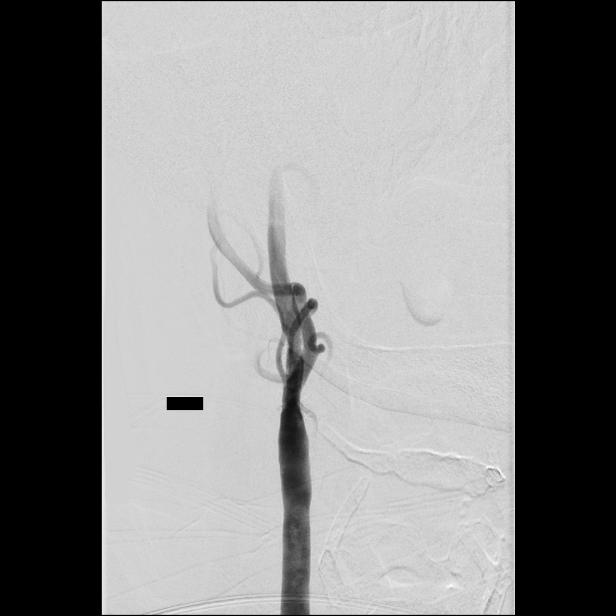
[im 57/188]
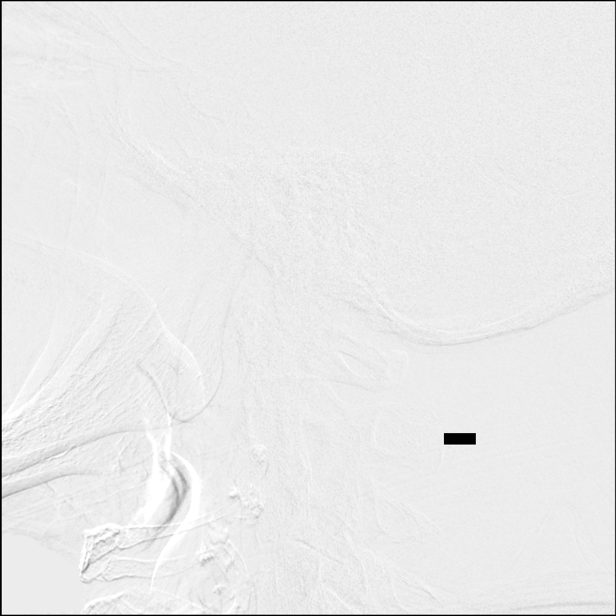
[im 74/188]
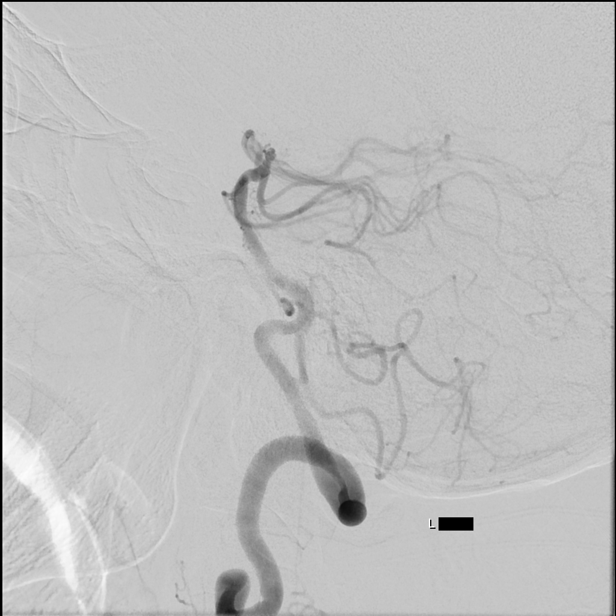
[im 90/188]
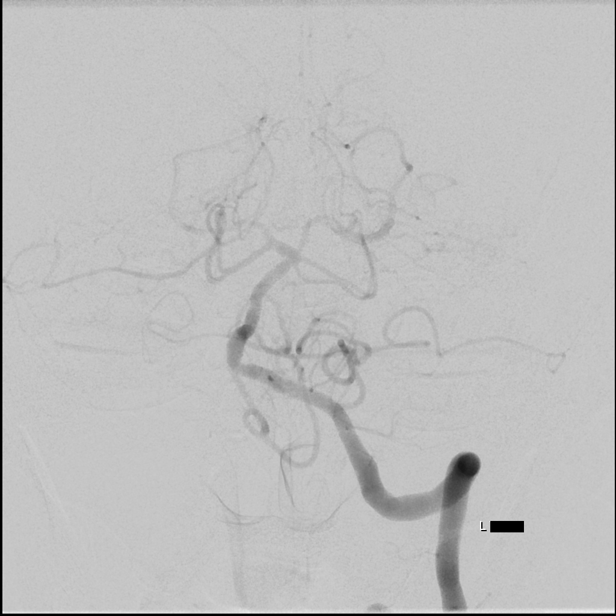
[im 106/188]
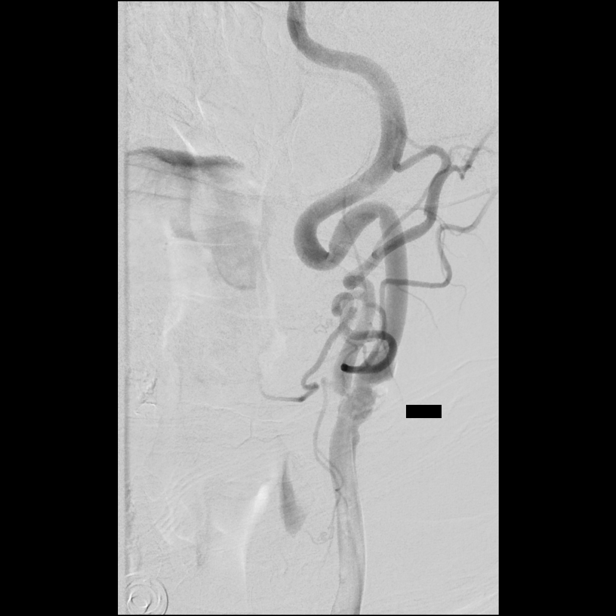
[im 122/188]
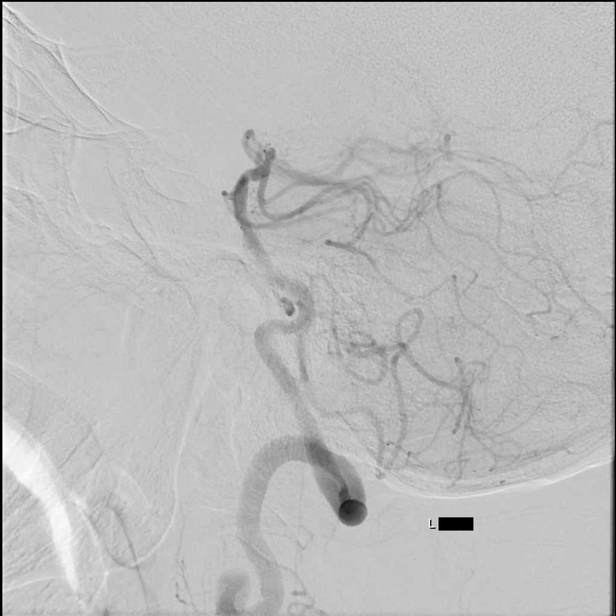
[im 139/188]
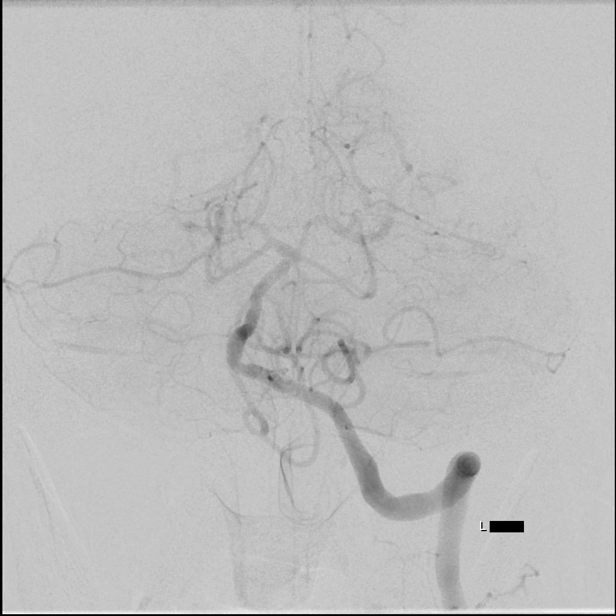
[im 155/188]
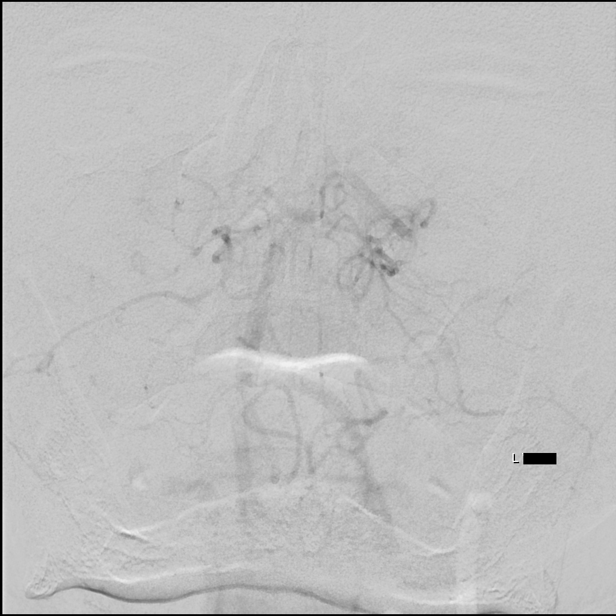
[im 171/188]
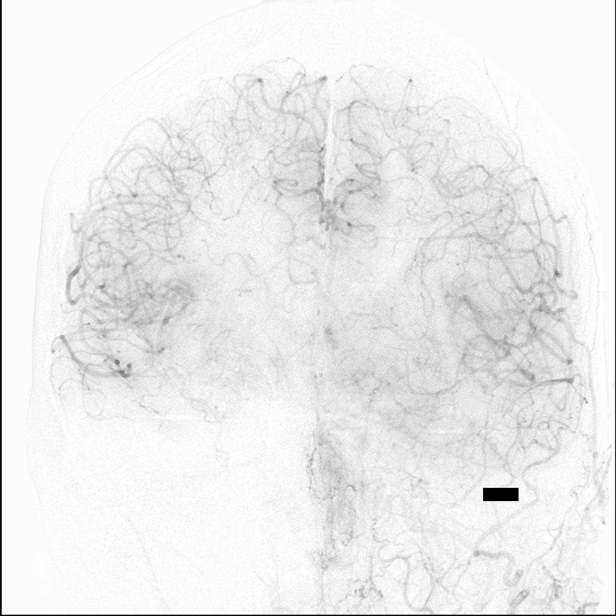
[im 188/188]
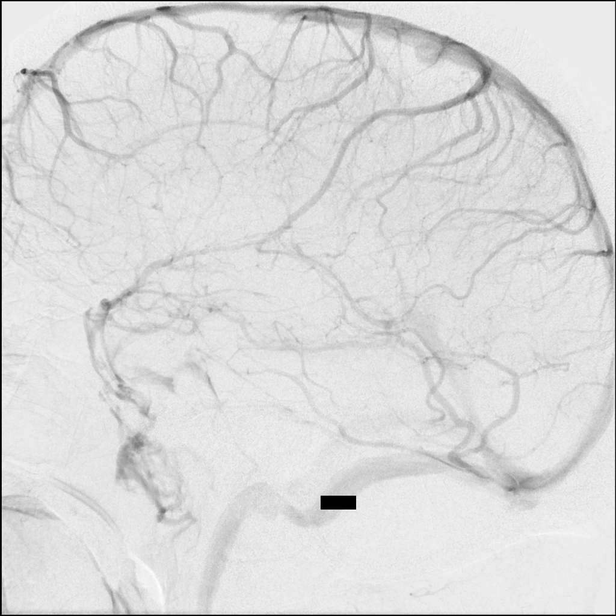

[12 of 24 positions shown; findings below may reference images not displayed]

EXAM:
BILATERAL COMMON CAROTID AND INNOMINATE ANGIOGRAPHY AND BILATERAL
VERTEBRAL ARTERY ANGIOGRAMS

PROCEDURE:
Contrast: 60mL 5M1XEI-EYY IOPAMIDOL (5M1XEI-EYY) INJECTION 61%, 20mL
5M1XEI-EYY IOPAMIDOL (5M1XEI-EYY) INJECTION 61%

Anesthesia/Sedation:  Conscious sedation.

Medications: Versed 1 mg IV. Fentanyl 25 mcg IV. Heparin 4444 units
IV.

Following a full explanation of the procedure along with the
potential associated complications, an informed witnessed consent
was obtained.

The right groin was prepped and draped in the usual sterile fashion.
Thereafter using modified Seldinger technique, transfemoral access
into the right common femoral artery was obtained without
difficulty. Over a 0.035 inch guidewire, a 5 French Pinnacle sheath
was inserted. Through this, and also over 0.035 inch guidewire, a 5
French JB 1 catheter was advanced to the aortic arch region and
selectively positioned in the right common carotid artery, the right
subclavian artery, the left common carotid artery and the left
subclavian artery.

There were no acute complications. The patient tolerated the
procedure well.
FINDINGS: The right common carotid arteriogram demonstrates the right external
carotid artery and its major branches to be widely patent.

The right internal carotid artery at the bulb and just distal to the
bulb demonstrates a long segment irregular atherosclerotic plaque
with a shallow ulcer proximally. There is associated approximately
50% stenosis by the NASCET criteria.

There is moderate tortuosity of the mid cervical right ICA. More
distally patency of the petrous and the cavernous segments is noted.

There is complete occlusion of the right internal carotid artery
just distal to the origin of the right posterior communicating
artery. The delayed arterial phase demonstrates attempt at
leptomeningeal collateralization from the P3 segments of the right
posterior cerebral artery supplying the posterior parietal right MCA
distribution.

The right subclavian arteriogram demonstrates complete occlusion of
a hypoplastic right vertebral artery at the level of C1.

The left common carotid arteriogram demonstrates the left external
carotid artery to be severely narrowed by approximately 75%. Its
branches however are seen to opacify adequately.

Also demonstrated is a shelf-like plaque of the posterolateral
aspect of the left internal carotid artery resulting in
approximately 30% stenosis by NASCET criteria.

There is a U shaped tortuosity of the mid cervical left ICA without
evidence of kinking.

More distally, the vessel is seen to opacify normally to the cranial
skull base. Normal opacification is seen of the petrous, cavernous
and the supraclinoid segments.

Left posterior communicating artery is seen opacifying the left
posterior cerebral artery distribution.

The left middle cerebral artery and the left anterior cerebral
artery are seen to opacify into the capillary and the venous phases.

Prompt simultaneous opacification via the anterior communicating
artery of the right anterior cerebral artery A1-A2 segments and
distally, and the right middle cerebral artery distribution is
noted.

The is a moderate 50% narrowing of the left anterior cerebral artery
A1 segment. The left subclavian arteriogram demonstrates
approximately 75% percent stenosis of the dominant left vertebral
artery at its origin. This is associated with a small plaque just
proximal to its origin.

Mild-to-moderate irregularity of the subclavian artery proximally is
related to atherosclerotic disease.

There is mild tortuosity of the left vertebral artery proximally.

More distally the vessel is seen to opacify to the cranial skull
base. Opacification of the left posterior inferior cerebellar artery
is noted.

Mild narrowing of the left vertebrobasilar junction just proximal to
the basilar artery is noted as well as the mid basilar artery.

A tapered narrowing of the distal basilar artery of about 50% is
noted as well.

The left posterior cerebral artery and the right posterior cerebral
artery demonstrate focal areas of caliber irregularity suggestive of
intracranial arteriosclerosis.

Retrograde opacification of the hypoplastic right vertebrobasilar
junction to the right posterior-inferior cerebellar artery is also
noted.
IMPRESSION: Approximately 75% narrowing of the dominant left vertebral artery at
its origin.

Occluded right internal carotid artery just distal to the origin of
the right posterior communicating artery.

Occluded right vertebral artery at the level of C1.

Severe stenosis of the left external carotid artery at its origin.

Approximately 30% stenosis of the left internal carotid artery at
the bulb by the NASCET criteria without associated ulcerations.

Approximately 50% stenosis of the distal basilar artery, and to a
lesser degree of the mid basilar artery.

Mild-to-moderate intracranial arteriosclerosis involving primarily
the posterior circulation as described.

## 2017-03-29 IMAGING — US US RENAL
1 series · 14 of 25 positions shown · non-contrast
Comparison: None.

CLINICAL DATA: Acute onset of renal failure.  Initial encounter.

EXAM:
RENAL / URINARY TRACT ULTRASOUND COMPLETE

[Series 1: us renal · 0.25mm/px · 14 of 26 slices shown]
[im 1/26]
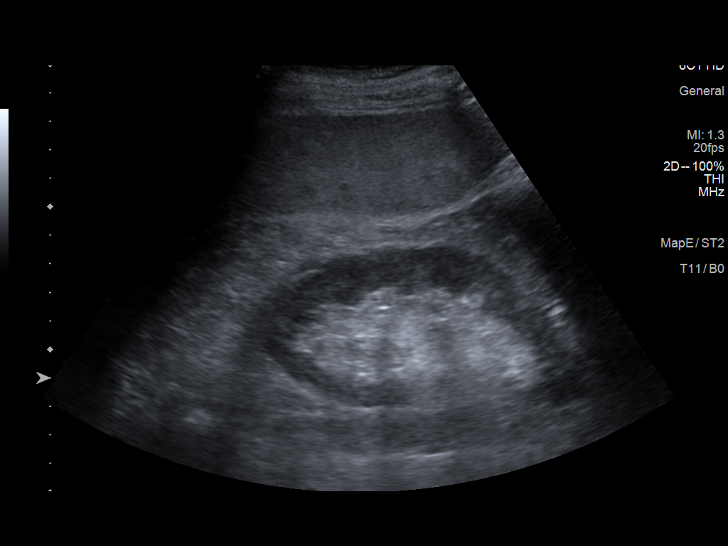
[im 3/26]
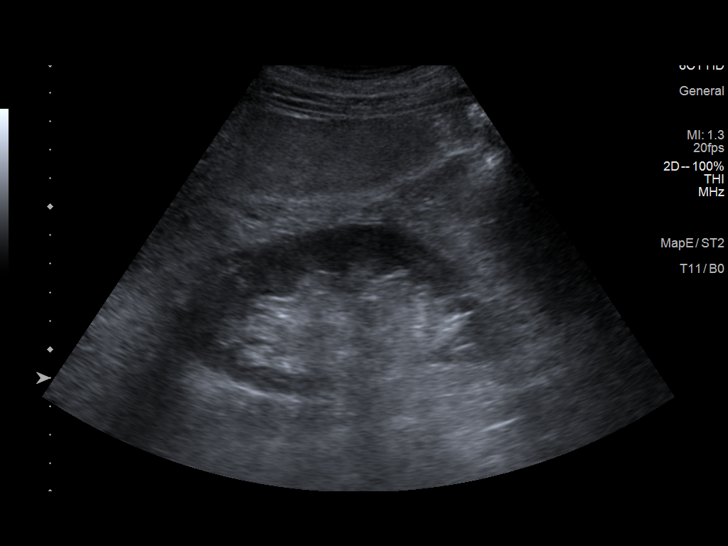
[im 5/26]
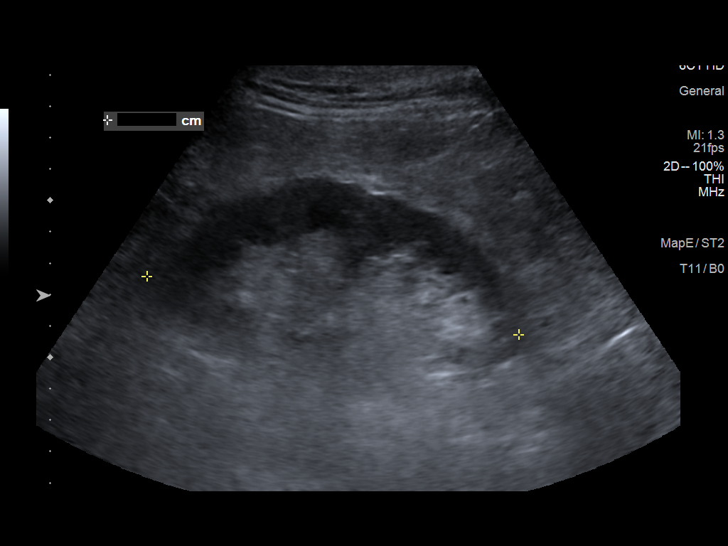
[im 7/26]
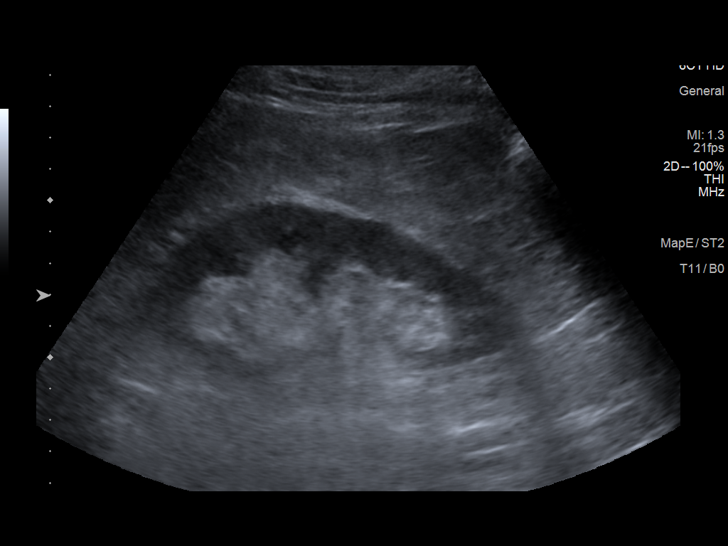
[im 9/26]
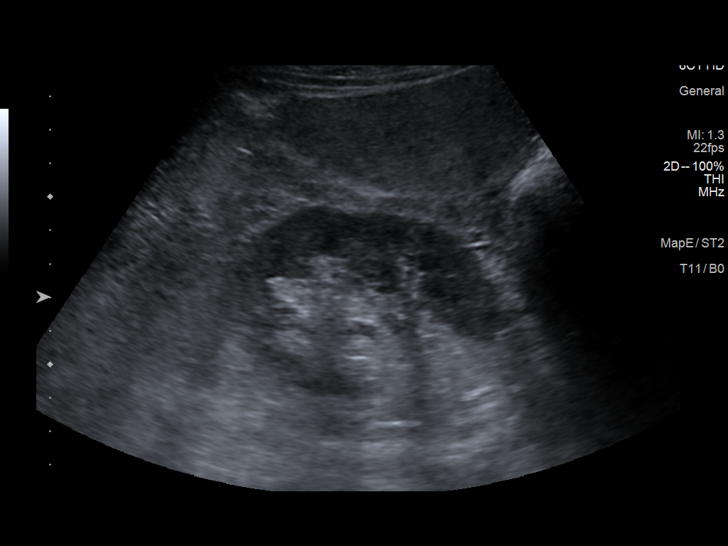
[im 10/26]
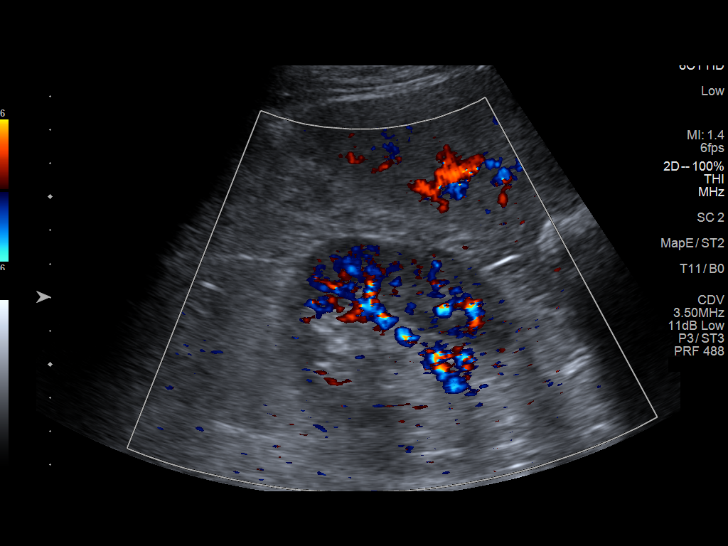
[im 12/26]
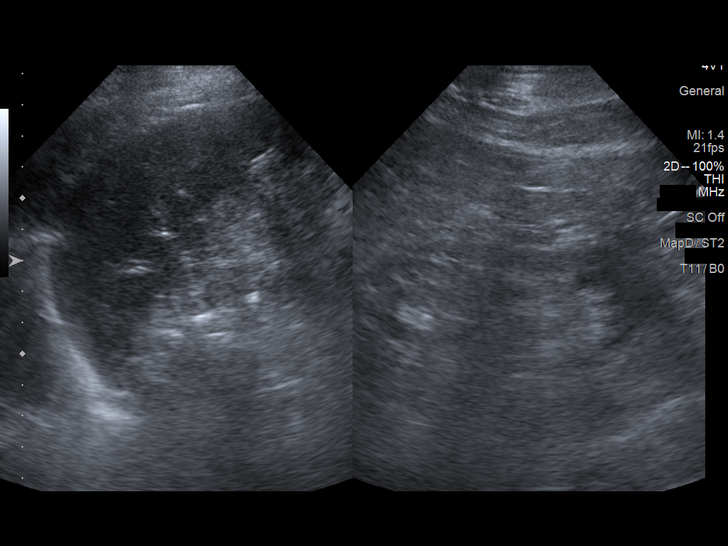
[im 14/26]
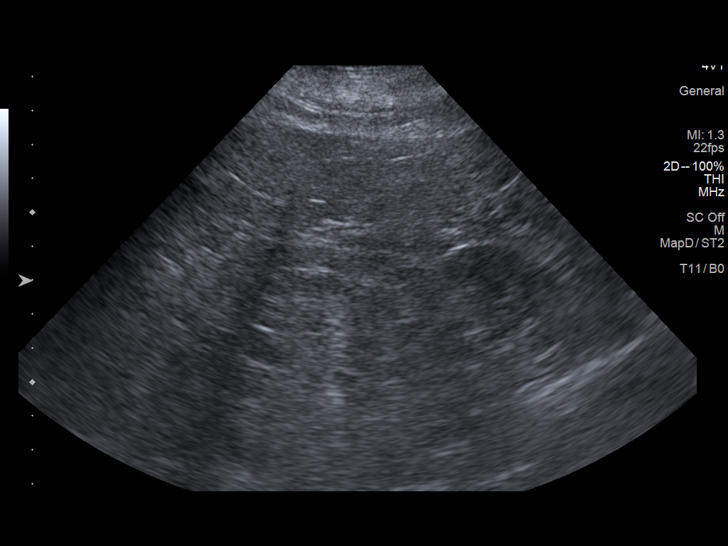
[im 16/26]
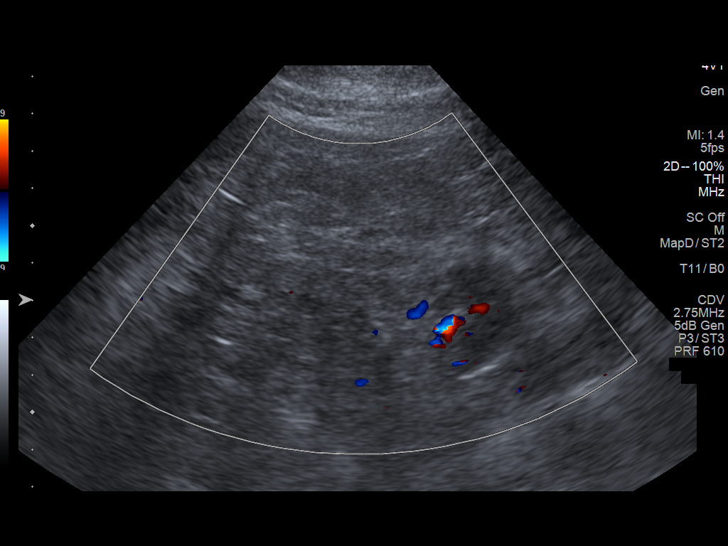
[im 17/26]
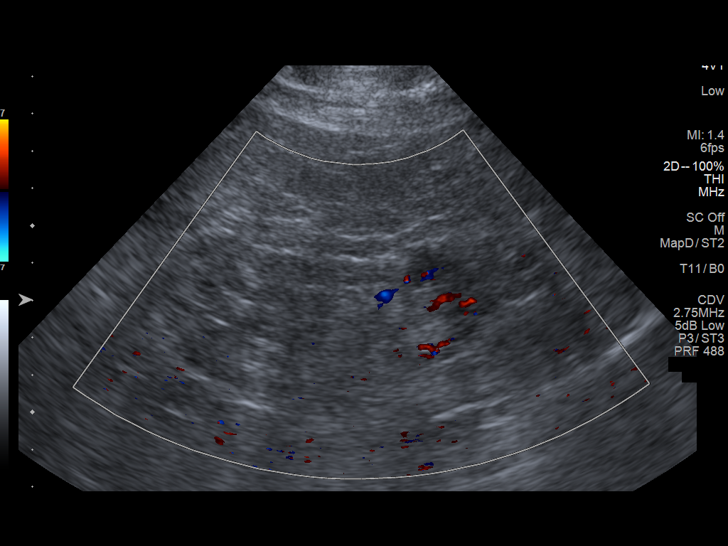
[im 19/26]
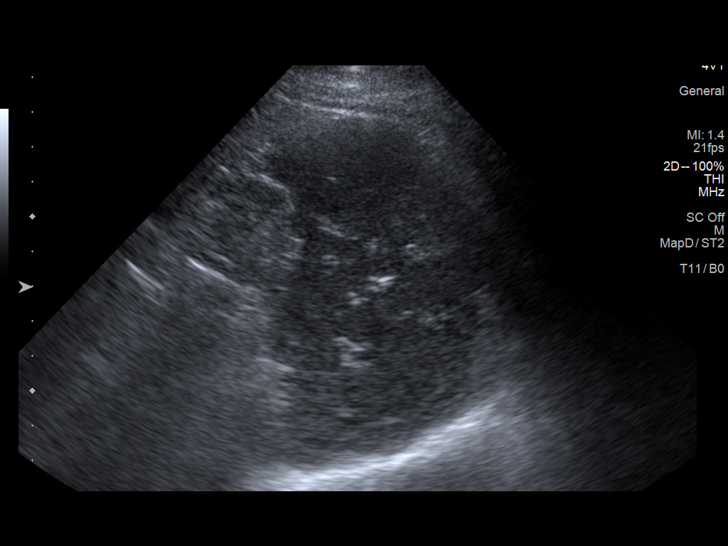
[im 21/26]
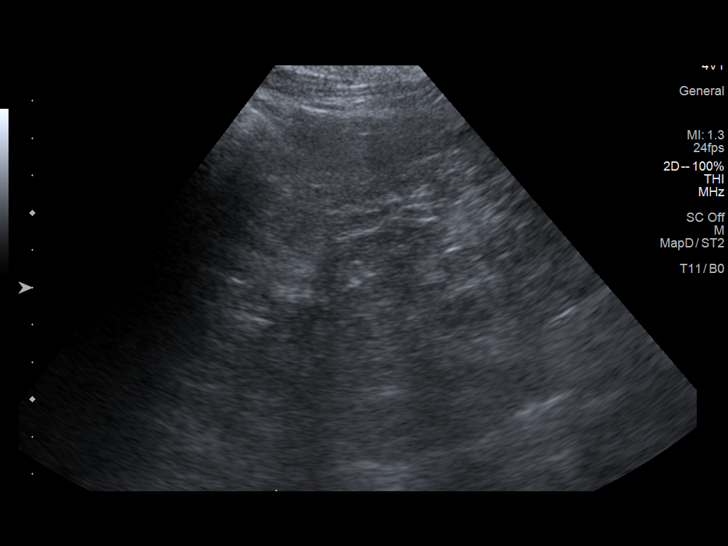
[im 23/26]
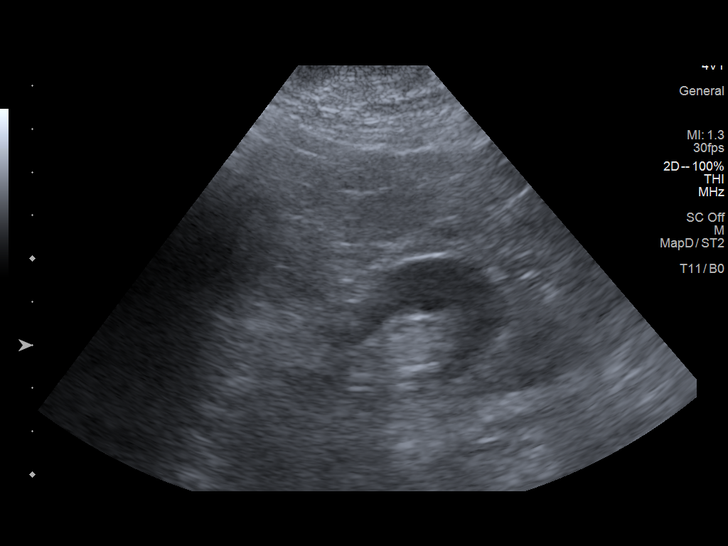
[im 26/26]
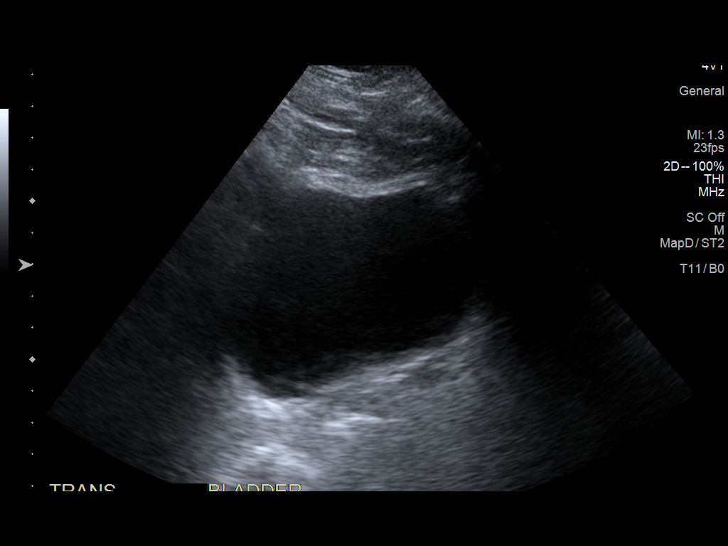

[14 of 25 positions shown; findings below may reference images not displayed]

FINDINGS: Right Kidney:

Length: 12.0 cm. Echogenicity within normal limits. No mass or
hydronephrosis visualized.

Left Kidney:

Length: 9.1 cm. Echogenicity within normal limits. Mild cortical
thinning is noted. No mass or hydronephrosis visualized.

Bladder:

Appears normal for degree of bladder distention.

Scattered calcified granulomata are noted within the spleen.
IMPRESSION: 1. No evidence of hydronephrosis.
2. Mild left renal cortical thinning may reflect chronic renal
disease or underlying mild atrophy.

## 2017-05-05 IMAGING — XA IR TRANSCATH EXCRAN VERT OR CAR A STENT
1 series · 9 of 24 positions shown · IV contrast (IODINE)
Comparison: Catheter angiogram of 08/30/2015.

INDICATION: Progressively worsening symptoms of vertebrobasilar insufficiency
comprising disorientation, balance difficulty and vertigo.

EXAM:
IR TRANSCATH EXCRAN VERT OR CAR A STENT STENT ASSISTED ANGIOPLASTY
OF SYMPTOMATIC SEVERE LEFT VERTEBRAL ARTERY STENOSIS AT THE ORIGIN:
TECHNIQUE: Informed written consent was obtained from the patient after a
thorough discussion of the procedural risks, benefits and
alternatives. All questions were addressed. Maximal Sterile Barrier
Technique was utilized including caps, mask, sterile gowns, sterile
gloves, sterile drape, hand hygiene and skin antiseptic. A timeout
was performed prior to the initiation of the procedure.

[Series 300: dr. (person_name) · 9 of 119 slices shown]
[im 6/119]
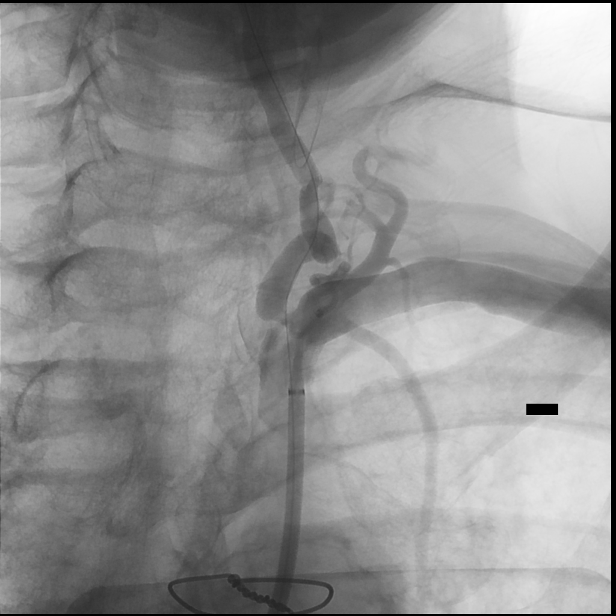
[im 21/119]
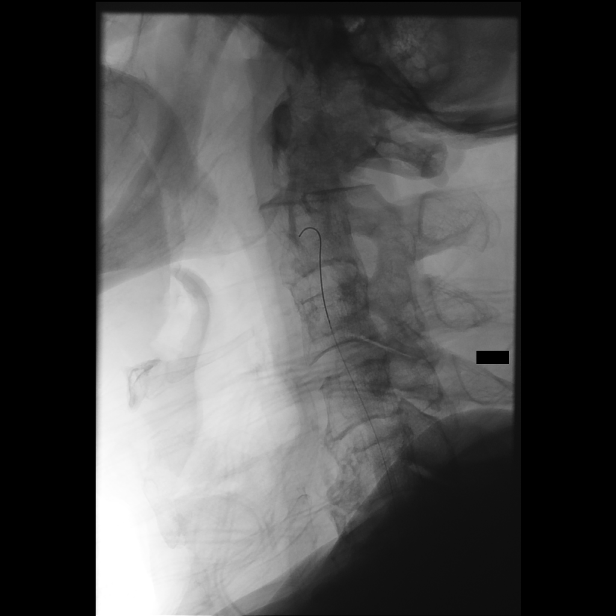
[im 31/119]
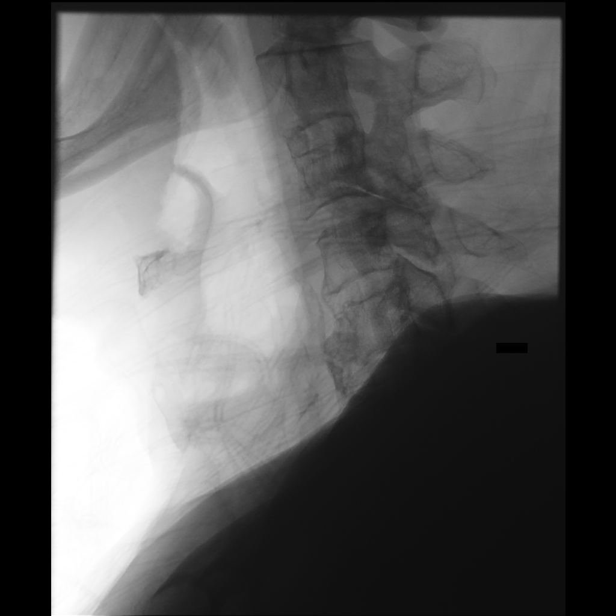
[im 47/119]
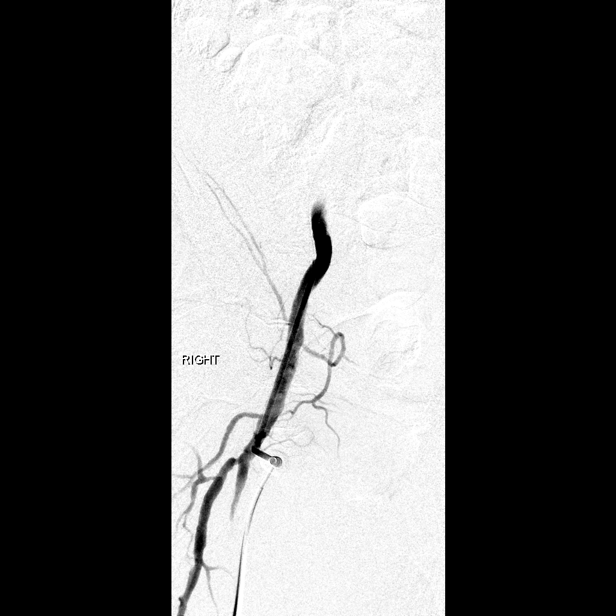
[im 62/119]
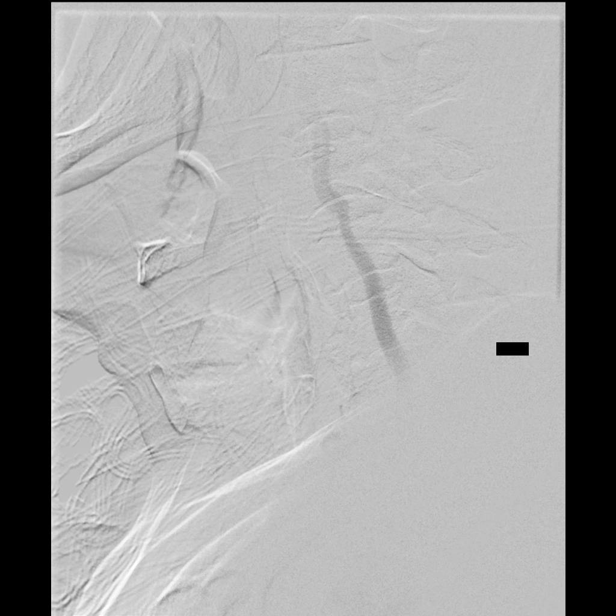
[im 72/119]
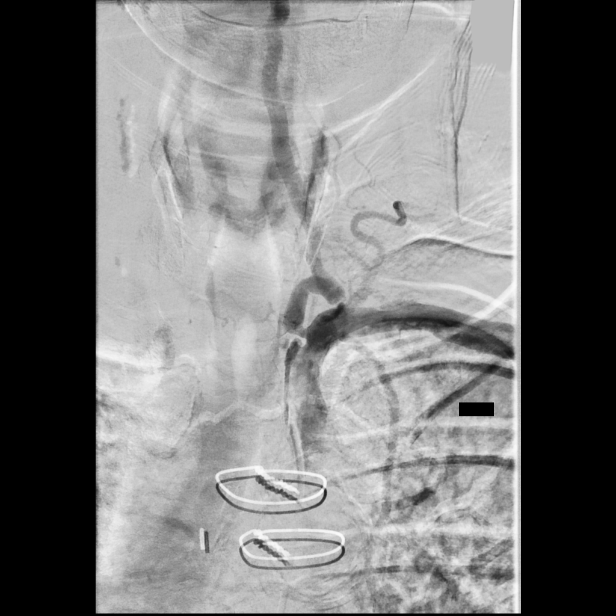
[im 88/119]
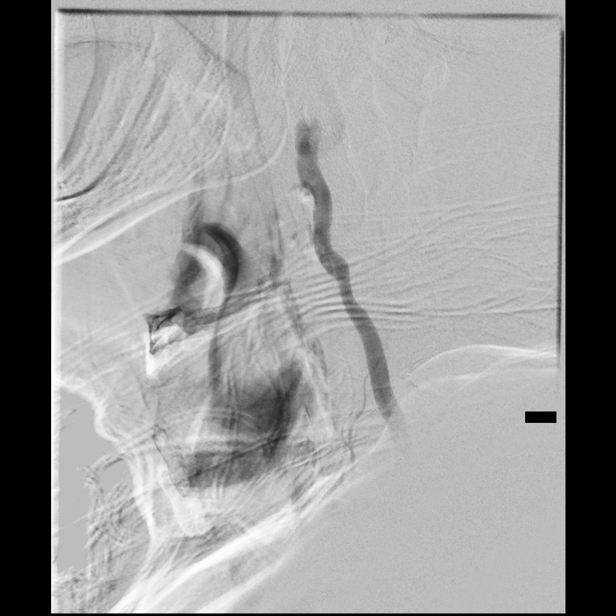
[im 103/119]
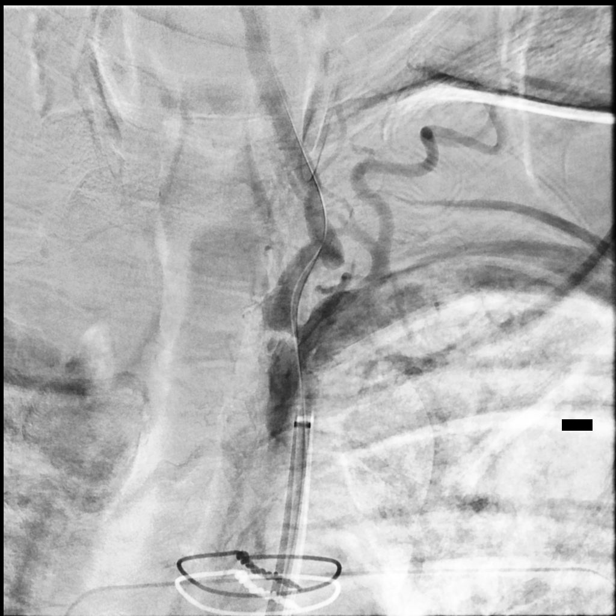
[im 113/119]
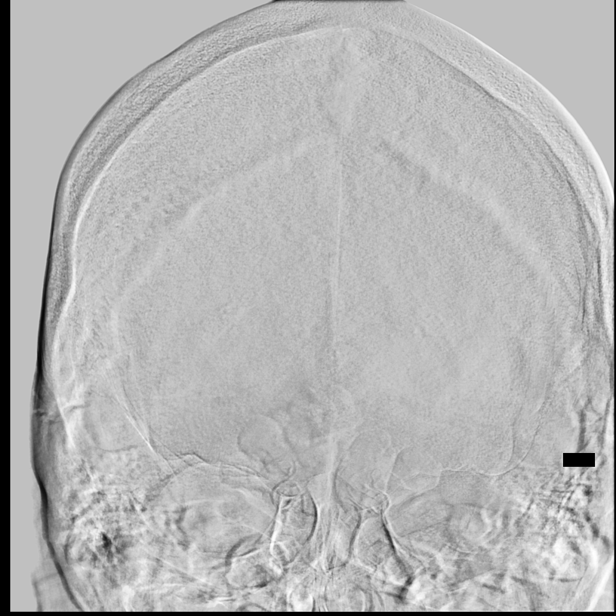

[9 of 24 positions shown; findings below may reference images not displayed]

MEDICATIONS:
Ancef 2 g IV.. The antibiotic was administered within 1 hour of the
procedure.

ANESTHESIA/SEDATION:
Anesthesia Service at [HOSPITAL] provided MAC anesthesia.

CONTRAST:  Isovue 300 approximately 75 mL.

FLUOROSCOPY TIME:  Fluoroscopy Time: 58 minutes 50 seconds (9999
mGy).

COMPLICATIONS:
None immediate.
Prior to the procedure, extensive discussions were held with the
patient, and the patient's wife and daughter.

The abnormal lab values in terms of renal dysfunction were reviewed.
Also reviewed was the most recent cardiology consultation from the
patient's cardiologist Dr. Af U at [HOSPITAL]. Options discussed
were those of first, further cardiac evaluation by his cardiologist
as per his consultation, versus consideration of proceeding with
endovascular treatment of the symptomatic left vertebral artery
origin stenosis.

The potential risk of worsening of renal function and possible acute
renal failure requiring dialysis, and also cardiac complications
were all reviewed with the family in detail.

The patient and his wife express the view to continue with the stent
assisted angioplasty of the left vertebral artery origin.

The patient was brought to the angiography suite. He was laid on the
fluoroscopic table. The skin overlying the right groin was then
prepped and draped in the usual sterile fashion. Thereafter using
modified Seldinger technique, transfemoral access into the right
common femoral artery was obtained without difficulty. Over a
inch guidewire a 5 French Pinnacle sheath was inserted. Through
this, and also over a 0.035 inch guidewire a 5 French JB 1 catheter
was advanced to the aortic arch region and selectively positioned
just proximal to the left vertebral artery origin.

Arteriograms then perfor[REDACTED]ed extra cranially and
intracranially.

The patient tolerated the procedure well.
FINDINGS: The left subclavian arteriogram again demonstrates a shelf-like
plaque just proximal to the origin of the left vertebral artery. The
vertebral artery origin itself again demonstrates a tight focal
stenoses with moderate tortuosity in its proximal one third just
distal to the origin. More distally, there is approximately 50%
stenosis of the left vertebral artery at the level of C4-C5. More
distally, the vessel was seen to opacify normally to the cranial
skull base. Measurements were then performed of the length of the
severe narrowing, with the landing zone proximally and distally. At
this time, the diagnostic JB 1 catheter which had been advanced
distal to the origin of the left vertebral artery was then exchanged
over a 0.035 inch, 300 cm, Rosen exchange guidewire for a 7 French,
65 cm Arrow sheath. Good aspiration was obtained from the side port
of the Arrow neurovascular sheath. This was then connected to
continuous heparinized saline infusion. Over the Rosen exchange
guidewire, 90 cm, 6 French, BriteTip Envoy Softip guide catheter was
then advanced and positioned distal to the origin of the left
vertebral artery.

The exchange guidewire was then removed. Good aspiration was
obtained from the hub of the 6 French Envoy guide catheter. This was
then gradually retrieved proximally and engaged just at the origin
of the left vertebral artery. A control arteriogram performed
through the 6 French guide catheter demonstrated no change in the
extracranial or intracranial circulation.

At this time in a coaxial manner and with constant heparinized
saline infusion using biplane roadmap technique and constant
fluoroscopic guidance, a rapid transit two tip microcatheter was
advanced over a 0.014 inch Softip Synchro micro guidewire to the
distal end of the 6 French Envoy guide catheter.

With the micro guidewire leading with a J-tip configuration to avoid
dissections or inducing spasm, the combination was then navigated to
the distal cervical vertebral artery. The micro guidewire was
removed. Good aspiration was obtained from the hub of the
microcatheter. This in turn was then exchanged for a 0.014 inch
Softip Transend 300 cm, exchange micro guidewire using biplane
roadmap technique and constant fluoroscopic guidance. The distal end
of the exchange guidewire had a J-tip configuration to avoid
dissections or inducing spasm.

The microcatheter was then removed. A control arteriogram performed
through the 6 French BriteTip Envoy guide catheter demonstrated
straightening of the left vertebral artery at the site of the severe
stenosis.

At this time, a 3 x 15 mm [REDACTED] angioplasty balloon catheter which
had been prepped and purged with 50% contrast and 50% heparinized
saline infusion was then advanced in a coaxial manner and with
constant heparinized saline infusion using biplane roadmap technique
and constant fluoroscopic guidance to cover the tight focal
stenoses.

After having ascertained safe positioning of the distal and proximal
markers, this was then inflated with a microinflation syringe device
via microtubing to its nominal atmospheric pressure achieving a 3 mm
diameter. This was then left inflated for approximately 20 seconds.
The balloon was then deflated and retrieved and removed. A control
arteriogram performed through the 6 French Envoy guide catheter
demonstrated significantly improved caliber and flow through the
angioplastied segment.

At this time, a 4.5 x 8 mm Vision balloon mountable stent which had
been retrogradely purged with heparinized saline infusion was then
advanced in a coaxial manner and with constant heparinized saline
infusion using biplane roadmap technique and constant fluoroscopic
guidance using the rapid exchange technique and position such that
the distal and proximal markers of the loaded stent were optimal.

Thereafter, this was then deployed by inflating the balloon with the
stent to achieve a diameter of approximately 4.22 mm where it was
maintained for approximately 15 seconds. The balloon was then
deflated and retrieved proximally and removed. A control arteriogram
performed through the 6 French BriteTip Envoy guide catheter
demonstrated excellent apposition proximally with the proximal
portion of the landing of the Vision stent just proximal to the
orifice of the left vertebral artery.

Significantly improved caliber of the angioplastied and stented
segment was noted. There was improved flow more distally.

The exchange micro guidewire was maintained in the distal vertical
segment of the left vertebral artery. Control arteriograms were then
performed at [DATE] and 40 minutes post stent placement. These
continued to demonstrate excellent flow through the stented segment
and intracranially. Patency of the posterior cerebral artery,
superior cerebral arteries and the anterior-inferior cerebellar
arteries including the basilar artery was maintained.

At the end of 40 minutes, under fluoroscopic guidance, the exchange
micro guidewire was removed. A final control arteriogram performed
through the 6 French Envoy guide catheter demonstrated excellent
apposition and improved caliber of the stented segment of the left
vertebral artery origin. There was a residual stenoses of
approximately 10%.

The 6 French BriteTip Envoy guide catheter and the 7 French Arrow
sheath were then retrieved into the abdominal aorta and exchanged
over a J-Tip guidewire for a 7 French Pinnacle sheath. This was then
removed and minimal pressure was held at the puncture site for
approximately 20 minutes with hemostasis being achieved.

The distal pulses remained stable and Dopplerable at the end of the
procedure.

Throughout the procedure, the patient's ACT was maintained in the
region of 150 to 160 seconds. Clinically, the patient remained
stable without new neurological signs or symptoms.

Hemodynamically and also his cardiac rhythm remained fairly stable
except for 1-2 spells where the patient became bradycardic in the
30s but without symptoms. The patient was then transported to the
neuro ICU to continue on IV heparin overnight, and also to monitor
his heart rate and blood pressure.

The patient was seen by cardiology because of patient's cardiac
history, cardiac ejection fraction of 20-25% and episodic severe
bradycardia and sick sinus syndrome.

The patient's overnight stay was unremarkable. The following day,
the patient was started on Brilinta 90 mg b.i.d. and aspirin. The IV
heparin was stopped. The patient was able to ambulate independently
and eating without difficulty. He was then discharged home under the
care of his family. The patient will be seen in follow-up in the
clinic in approximately two weeks time. He was asked to maintain
good hydration by drinking water. He was told to avoid caffeine
related products. Instructions were given for the patient to refrain
from stooping, bending or lifting anything above 10 pounds for a
couple of weeks. Also, he was asked to refrain from driving though
he could ride in a car. He was also asked to resume his preprocedure
medications. The patient may also need a follow-up Chem 7 in view of
his renal dysfunction next week by his primary care doctor.
IMPRESSION: Status post endovascular stent assisted angioplasty of symptomatic
dominant severe stenosis of the proximal left vertebral artery as
described above without event.
